# Patient Record
Sex: Female | Born: 1985 | Race: White | Hispanic: No | Marital: Married | Smoking: Current every day smoker
Health system: Southern US, Community
[De-identification: ages and names within clinical notes are randomized; demographics above are authoritative.]

## PROBLEM LIST (undated history)

## (undated) DIAGNOSIS — K8020 Calculus of gallbladder without cholecystitis without obstruction: Secondary | ICD-10-CM

## (undated) DIAGNOSIS — K759 Inflammatory liver disease, unspecified: Secondary | ICD-10-CM

---

## 2006-08-09 ENCOUNTER — Emergency Department (HOSPITAL_COMMUNITY): Admission: EM | Admit: 2006-08-09 | Discharge: 2006-08-09 | Payer: Self-pay | Admitting: Emergency Medicine

## 2006-09-06 ENCOUNTER — Emergency Department (HOSPITAL_COMMUNITY): Admission: EM | Admit: 2006-09-06 | Discharge: 2006-09-06 | Payer: Self-pay | Admitting: Emergency Medicine

## 2006-10-08 ENCOUNTER — Emergency Department (HOSPITAL_COMMUNITY): Admission: EM | Admit: 2006-10-08 | Discharge: 2006-10-08 | Payer: Self-pay | Admitting: Family Medicine

## 2007-01-15 ENCOUNTER — Emergency Department (HOSPITAL_COMMUNITY): Admission: EM | Admit: 2007-01-15 | Discharge: 2007-01-15 | Payer: Self-pay | Admitting: Emergency Medicine

## 2007-02-04 ENCOUNTER — Emergency Department (HOSPITAL_COMMUNITY): Admission: EM | Admit: 2007-02-04 | Discharge: 2007-02-04 | Payer: Self-pay | Admitting: Emergency Medicine

## 2007-02-15 ENCOUNTER — Emergency Department (HOSPITAL_COMMUNITY): Admission: EM | Admit: 2007-02-15 | Discharge: 2007-02-15 | Payer: Self-pay | Admitting: Emergency Medicine

## 2007-02-19 ENCOUNTER — Emergency Department (HOSPITAL_COMMUNITY): Admission: EM | Admit: 2007-02-19 | Discharge: 2007-02-19 | Payer: Self-pay | Admitting: Emergency Medicine

## 2007-03-02 ENCOUNTER — Emergency Department (HOSPITAL_COMMUNITY): Admission: EM | Admit: 2007-03-02 | Discharge: 2007-03-02 | Payer: Self-pay | Admitting: Emergency Medicine

## 2007-03-08 ENCOUNTER — Emergency Department (HOSPITAL_COMMUNITY): Admission: EM | Admit: 2007-03-08 | Discharge: 2007-03-08 | Payer: Self-pay | Admitting: Emergency Medicine

## 2007-03-26 ENCOUNTER — Emergency Department (HOSPITAL_COMMUNITY): Admission: EM | Admit: 2007-03-26 | Discharge: 2007-03-26 | Payer: Self-pay | Admitting: Emergency Medicine

## 2008-09-19 DIAGNOSIS — J45909 Unspecified asthma, uncomplicated: Secondary | ICD-10-CM

## 2008-09-19 DIAGNOSIS — F1120 Opioid dependence, uncomplicated: Secondary | ICD-10-CM

## 2008-10-11 DIAGNOSIS — R300 Dysuria: Secondary | ICD-10-CM

## 2008-11-01 ENCOUNTER — Ambulatory Visit (HOSPITAL_COMMUNITY): Admission: RE | Admit: 2008-11-01 | Discharge: 2008-11-01 | Payer: Self-pay | Admitting: Urology

## 2009-01-02 ENCOUNTER — Emergency Department (HOSPITAL_COMMUNITY): Admission: EM | Admit: 2009-01-02 | Discharge: 2009-01-02 | Payer: Self-pay | Admitting: Emergency Medicine

## 2009-01-24 DIAGNOSIS — N3020 Other chronic cystitis without hematuria: Secondary | ICD-10-CM

## 2009-10-04 ENCOUNTER — Emergency Department: Payer: Self-pay | Admitting: Emergency Medicine

## 2010-05-26 ENCOUNTER — Emergency Department: Payer: Self-pay | Admitting: Emergency Medicine

## 2010-11-17 LAB — URINALYSIS, ROUTINE W REFLEX MICROSCOPIC
Glucose, UA: NEGATIVE mg/dL
Hgb urine dipstick: NEGATIVE
Specific Gravity, Urine: 1.014 (ref 1.005–1.030)
Urobilinogen, UA: 2 mg/dL — ABNORMAL HIGH (ref 0.0–1.0)

## 2010-11-17 LAB — URINE MICROSCOPIC-ADD ON

## 2011-05-24 LAB — DIFFERENTIAL
Eosinophils Absolute: 0.1
Eosinophils Relative: 1
Lymphs Abs: 4.2 — ABNORMAL HIGH
Monocytes Relative: 4

## 2011-05-24 LAB — POCT URINALYSIS DIP (DEVICE)
Bilirubin Urine: NEGATIVE
Ketones, ur: NEGATIVE
Specific Gravity, Urine: 1.02

## 2011-05-24 LAB — CBC
HCT: 44.4
MCV: 87.9
RBC: 5.05
WBC: 11.9 — ABNORMAL HIGH

## 2011-05-24 LAB — POCT PREGNANCY, URINE
Operator id: 239701
Preg Test, Ur: NEGATIVE

## 2011-05-24 LAB — RAPID URINE DRUG SCREEN, HOSP PERFORMED
Amphetamines: NOT DETECTED
Barbiturates: NOT DETECTED
Benzodiazepines: NOT DETECTED

## 2011-07-14 ENCOUNTER — Emergency Department: Payer: Self-pay | Admitting: Emergency Medicine

## 2012-05-27 ENCOUNTER — Emergency Department: Payer: Self-pay | Admitting: Emergency Medicine

## 2012-06-04 ENCOUNTER — Emergency Department: Payer: Self-pay | Admitting: Neurosurgery

## 2013-05-01 ENCOUNTER — Emergency Department: Payer: Self-pay | Admitting: Internal Medicine

## 2015-04-11 ENCOUNTER — Emergency Department: Payer: Medicaid Other

## 2015-04-11 ENCOUNTER — Other Ambulatory Visit: Payer: Self-pay

## 2015-04-11 ENCOUNTER — Emergency Department
Admission: EM | Admit: 2015-04-11 | Discharge: 2015-04-11 | Disposition: A | Discharge status: Home / Self Care | Payer: Medicaid Other | Attending: Emergency Medicine | Admitting: Emergency Medicine

## 2015-04-11 ENCOUNTER — Encounter: Payer: Self-pay | Admitting: *Deleted

## 2015-04-11 DIAGNOSIS — R1011 Right upper quadrant pain: Secondary | ICD-10-CM

## 2015-04-11 DIAGNOSIS — Z720 Tobacco use: Secondary | ICD-10-CM | POA: Diagnosis not present

## 2015-04-11 DIAGNOSIS — R1013 Epigastric pain: Secondary | ICD-10-CM | POA: Diagnosis present

## 2015-04-11 DIAGNOSIS — K8020 Calculus of gallbladder without cholecystitis without obstruction: Secondary | ICD-10-CM | POA: Diagnosis not present

## 2015-04-11 HISTORY — DX: Calculus of gallbladder without cholecystitis without obstruction: K80.20

## 2015-04-11 HISTORY — DX: Inflammatory liver disease, unspecified: K75.9

## 2015-04-11 LAB — COMPREHENSIVE METABOLIC PANEL
ALT: 19 U/L (ref 14–54)
AST: 26 U/L (ref 15–41)
Albumin: 4.3 g/dL (ref 3.5–5.0)
Alkaline Phosphatase: 51 U/L (ref 38–126)
Anion gap: 10 (ref 5–15)
BUN: 7 mg/dL (ref 6–20)
CHLORIDE: 99 mmol/L — AB (ref 101–111)
CO2: 26 mmol/L (ref 22–32)
CREATININE: 0.77 mg/dL (ref 0.44–1.00)
Calcium: 9.4 mg/dL (ref 8.9–10.3)
GFR calc Af Amer: >60 mL/min (ref >60)
Glucose, Bld: 125 mg/dL — ABNORMAL HIGH (ref 65–99)
Potassium: 3.4 mmol/L — ABNORMAL LOW (ref 3.5–5.1)
Sodium: 135 mmol/L (ref 135–145)
Total Bilirubin: 0.7 mg/dL (ref 0.3–1.2)
Total Protein: 8.1 g/dL (ref 6.5–8.1)

## 2015-04-11 LAB — LIPASE, BLOOD: LIPASE: 19 U/L — AB (ref 22–51)

## 2015-04-11 LAB — CBC
HCT: 43.7 % (ref 35.0–47.0)
Hemoglobin: 14.2 g/dL (ref 12.0–16.0)
MCH: 29.1 pg (ref 26.0–34.0)
MCHC: 32.5 g/dL (ref 32.0–36.0)
MCV: 89.6 fL (ref 80.0–100.0)
PLATELETS: 203 10*3/uL (ref 150–440)
RBC: 4.88 MIL/uL (ref 3.80–5.20)
RDW: 14.4 % (ref 11.5–14.5)
WBC: 13.1 10*3/uL — AB (ref 3.6–11.0)

## 2015-04-11 LAB — TROPONIN I: Troponin I: <0.03 ng/mL (ref <0.031)

## 2015-04-11 MED ORDER — ONDANSETRON 4 MG PO TBDP: 4.0000 mg | ORAL_TABLET | Freq: Three times a day (TID) | ORAL | Status: DC | PRN

## 2015-04-11 MED ORDER — KETOROLAC TROMETHAMINE 10 MG PO TABS
10.0000 mg | ORAL_TABLET | Freq: Once | ORAL | Status: AC
  Administered 2015-04-11: 10 mg via ORAL
  Filled 2015-04-11: qty 1

## 2015-04-11 MED ORDER — KETOROLAC TROMETHAMINE 10 MG PO TABS: 10.0000 mg | ORAL_TABLET | Freq: Three times a day (TID) | ORAL | Status: DC | PRN

## 2015-04-11 MED ORDER — ONDANSETRON 4 MG PO TBDP
4.0000 mg | ORAL_TABLET | Freq: Once | ORAL | Status: AC
  Administered 2015-04-11: 4 mg via ORAL
  Filled 2015-04-11: qty 1

## 2015-04-11 NOTE — Discharge Instructions (Signed)
Cholelithiasis °Cholelithiasis (also called gallstones) is a form of gallbladder disease in which gallstones form in your gallbladder. The gallbladder is an organ that stores bile made in the liver, which helps digest fats. Gallstones begin as small crystals and slowly grow into stones. Gallstone pain occurs when the gallbladder spasms and a gallstone is blocking the duct. Pain can also occur when a stone passes out of the duct.  °RISK FACTORS °· Being female.   °· Having multiple pregnancies. Health care providers sometimes advise removing diseased gallbladders before future pregnancies.   °· Being obese. °· Eating a diet heavy in fried foods and fat.   °· Being older than 60 years and increasing age.   °· Prolonged use of medicines containing female hormones.   °· Having diabetes mellitus.   °· Rapidly losing weight.   °· Having a family history of gallstones (heredity).   °SYMPTOMS °· Nausea.   °· Vomiting. °· Abdominal pain.   °· Yellowing of the skin (jaundice).   °· Sudden pain. It may persist from several minutes to several hours. °· Fever.   °· Tenderness to the touch.  °In some cases, when gallstones do not move into the bile duct, people have no pain or symptoms. These are called "silent" gallstones.  °TREATMENT °Silent gallstones do not need treatment. In severe cases, emergency surgery may be required. Options for treatment include: °· Surgery to remove the gallbladder. This is the most common treatment. °· Medicines. These do not always work and may take 6-12 months or more to work. °· Shock wave treatment (extracorporeal biliary lithotripsy). In this treatment an ultrasound machine sends shock waves to the gallbladder to break gallstones into smaller pieces that can pass into the intestines or be dissolved by medicine. °HOME CARE INSTRUCTIONS  °· Only take over-the-counter or prescription medicines for pain, discomfort, or fever as directed by your health care provider.   °· Follow a low-fat diet until  seen again by your health care provider. Fat causes the gallbladder to contract, which can result in pain.   °· Follow up with your health care provider as directed. Attacks are almost always recurrent and surgery is usually required for permanent treatment.   °SEEK IMMEDIATE MEDICAL CARE IF:  °· Your pain increases and is not controlled by medicines.   °· You have a fever or persistent symptoms for more than 2-3 days.   °· You have a fever and your symptoms suddenly get worse.   °· You have persistent nausea and vomiting.   °MAKE SURE YOU:  °· Understand these instructions. °· Will watch your condition. °· Will get help right away if you are not doing well or get worse. °Document Released: 07/22/2005 Document Revised: 03/28/2013 Document Reviewed: 01/17/2013 °[employer]® Patient Information ©2015 [employer]. This information is not intended to replace advice given to you by your health care provider. Make sure you discuss any questions you have with your health care provider. ° °

## 2015-04-11 NOTE — ED Notes (Signed)
Pt reports upper abdominal and back pain for about 1 month. States she was dx with gallstones at [facility] practice about 1.5 months ago, pain feels similar.

## 2015-04-11 NOTE — ED Provider Notes (Signed)
[facility] Emergency Department Provider Note  ____________________________________________  Time seen: 3:30 AM  I have reviewed the triage vital signs and the nursing notes.   HISTORY  Chief Complaint Abdominal Pain     HPI Claudia Duke is a 29 y.o. female presents with right upper quadrant/epigastric pain times one month with acute worsening tonight. Patient states that she was diagnosed with gallstones at [facility] family practice approximately 1-1/2 months ago and that this pain is consistent with that. She denies any vomiting no nausea time. In addition patient admits to crack cocaine use last night with resultant chest pain. Patient denies any dyspnea    Past Medical History  Diagnosis Date  . Gallstone   . Hepatitis     There are no active problems to display for this patient.   Past surgical history None No current outpatient prescriptions on file.  Allergies Septra  No family history on file.  Social History Social History  Substance Use Topics  . Smoking status: Current Every Day Smoker  . Smokeless tobacco: None  . Alcohol Use: Yes    Review of Systems  Constitutional: Negative for fever. Eyes: Negative for visual changes. ENT: Negative for sore throat. Cardiovascular: Negative for chest pain. Respiratory: Negative for shortness of breath. Gastrointestinal: Positive for abdominal pain, negative for vomiting and diarrhea. Genitourinary: Negative for dysuria. Musculoskeletal: Negative for back pain. Skin: Negative for rash. Neurological: Negative for headaches, focal weakness or numbness.   10-point ROS otherwise negative.  ____________________________________________   PHYSICAL EXAM:  VITAL SIGNS: ED Triage Vitals  Enc Vitals Group     BP 04/11/15 0237 118/81 mmHg     Pulse Rate 04/11/15 0237 102     Resp 04/11/15 0237 16     Temp 04/11/15 0237 98.3 F (36.8 C)     Temp Source 04/11/15 0237 Oral     SpO2  04/11/15 0237 99 %     Weight 04/11/15 0237 120 lb (54.432 kg)     Height 04/11/15 0237  (1.6 m)     Head Cir --      Peak Flow --      Pain Score 04/11/15 0237 8     Pain Loc --      Pain Edu? --      Excl. in GC? --      Constitutional: Alert and oriented. Well appearing and in no distress. Eyes: Conjunctivae are normal. PERRL. Normal extraocular movements. ENT   Head: Normocephalic and atraumatic.   Nose: No congestion/rhinnorhea.   Mouth/Throat: Mucous membranes are moist.   Neck: No stridor. Hematological/Lymphatic/Immunilogical: No cervical lymphadenopathy. Cardiovascular: Normal rate, regular rhythm. Normal and symmetric distal pulses are present in all extremities. No murmurs, rubs, or gallops. Respiratory: Normal respiratory effort without tachypnea nor retractions. Breath sounds are clear and equal bilaterally. No wheezes/rales/rhonchi. Gastrointestinal: Right upper quadrant pain with palpation. No distention. There is no CVA tenderness. Genitourinary: deferred Musculoskeletal: Nontender with normal range of motion in all extremities. No joint effusions.  No lower extremity tenderness nor edema. Neurologic:  Normal speech and language. No gross focal neurologic deficits are appreciated. Speech is normal.  Skin:  Skin is warm, dry and intact. No rash noted. Psychiatric: Mood and affect are normal. Speech and behavior are normal. Patient exhibits appropriate insight and judgment.  ____________________________________________    LABS (pertinent positives/negatives)  Labs Reviewed  LIPASE, BLOOD - Abnormal; Notable for the following:    Lipase 19 (*)    All other components  within normal limits  COMPREHENSIVE METABOLIC PANEL - Abnormal; Notable for the following:    Potassium 3.4 (*)    Chloride 99 (*)    Glucose, Bld 125 (*)    All other components within normal limits  CBC - Abnormal; Notable for the following:    WBC 13.1 (*)    All other  components within normal limits  TROPONIN I  URINALYSIS COMPLETEWITH MICROSCOPIC ([facility] ONLY)  URINE DRUG SCREEN, QUALITATIVE ([facility] ONLY)  POC URINE PREG, ED     ____________________________________________   EKG  ED ECG REPORT I, BROWN, [place] N, the attending physician, personally viewed and interpreted this ECG.   Date: 04/11/2015  EKG Time: 3:57 AM  Rate: 87  Rhythm: Normal sinus rhythm  Axis: None  Intervals: Normal  ST&T Change: None   ____________________________________________    RADIOLOGY  US Abdomen Limited RUQ (Final result) Result time: 04/11/15 05:00:11   Procedure changed from US Abdomen Limited      Final result by Rad Results In Interface (04/11/15 05:00:11)   Narrative:   CLINICAL DATA: Right upper quadrant pain for 1-2 days  EXAM: US ABDOMEN LIMITED - RIGHT UPPER QUADRANT  COMPARISON: None.  FINDINGS: Gallbladder:  Multiple calculi are visible in the gallbladder. The largest is 1.2 cm. There is no gallbladder wall thickening or pericholecystic fluid. The patient was not tender over the gallbladder.  Common bile duct:  Diameter: 2.8 mm  Liver:  No focal lesion identified. Within normal limits in parenchymal echogenicity.  IMPRESSION: Cholelithiasis without evidence of cholecystitis.   Electronically Signed By: Ellery Plunk M.D.      INITIAL IMPRESSION / ASSESSMENT AND PLAN / ED COURSE  Pertinent labs & imaging results that were available during my care of the patient were reviewed by me and considered in my medical decision making (see chart for details).  History of physical exam consistent with cholelithiasis which was confirmed with ultrasound findings. No evidence of cholecystitis. We'll refer the patient to Dr. Lovett Sox for outpatient management of onset, getting cholelithiasis.  ____________________________________________   FINAL CLINICAL IMPRESSION(S) / ED DIAGNOSES  Final diagnoses:  Gallstones       Darci Current, MD 04/11/15 [phone]

## 2015-04-23 ENCOUNTER — Other Ambulatory Visit: Payer: Self-pay | Admitting: *Deleted

## 2015-04-23 ENCOUNTER — Encounter: Payer: Self-pay | Admitting: *Deleted

## 2015-04-28 ENCOUNTER — Ambulatory Visit: Payer: Self-pay | Admitting: Surgery

## 2015-04-30 ENCOUNTER — Ambulatory Visit: Payer: Self-pay | Admitting: Surgery

## 2017-08-05 IMAGING — US US ABDOMEN LIMITED
1 series · 14 of 25 positions shown · non-contrast
Comparison: None.

CLINICAL DATA: Right upper quadrant pain for 1-2 days

EXAM:
US ABDOMEN LIMITED - RIGHT UPPER QUADRANT

[Series 1: us abdomen limited · 0.18mm/px · 14 of 36 slices shown]
[im 1/36]
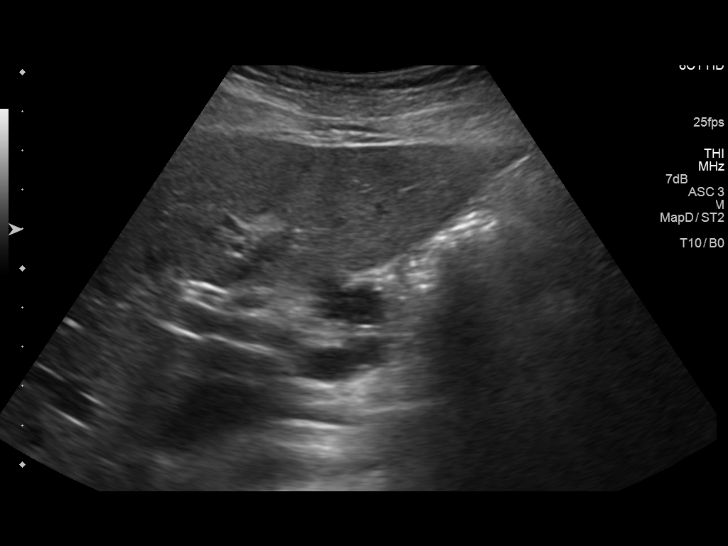
[im 3/36]
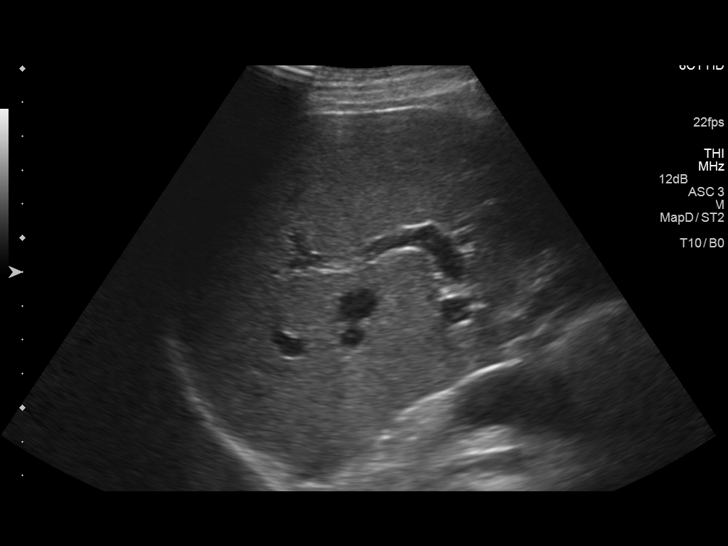
[im 6/36]
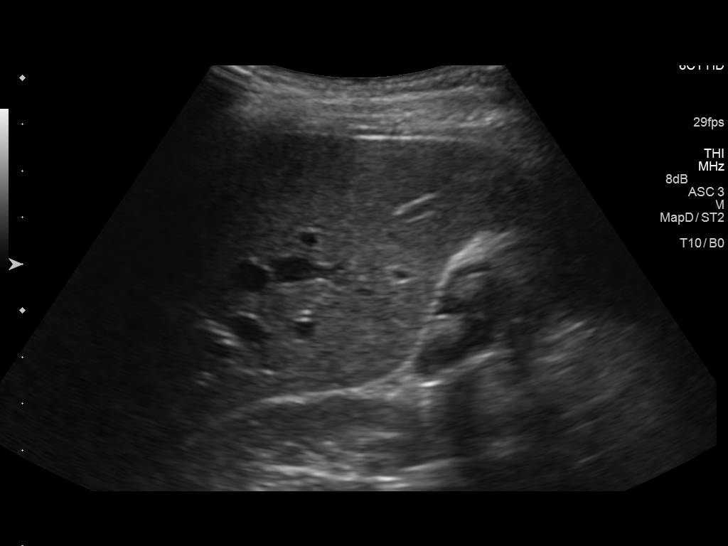
[im 9/36]
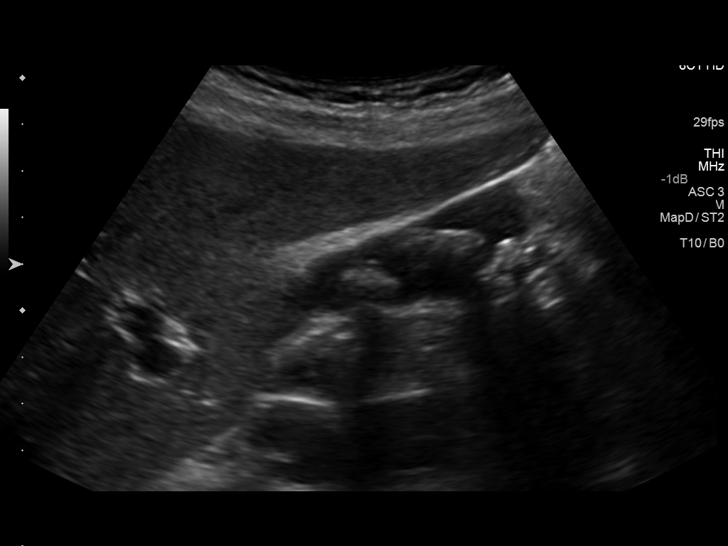
[im 12/36]
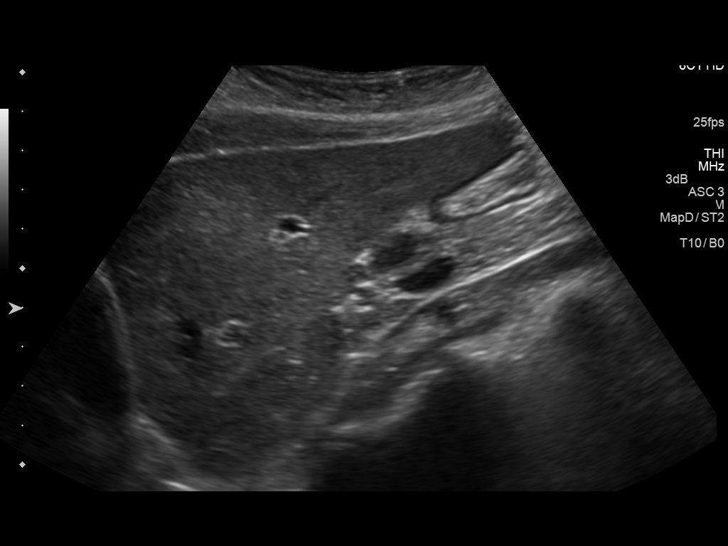
[im 14/36]
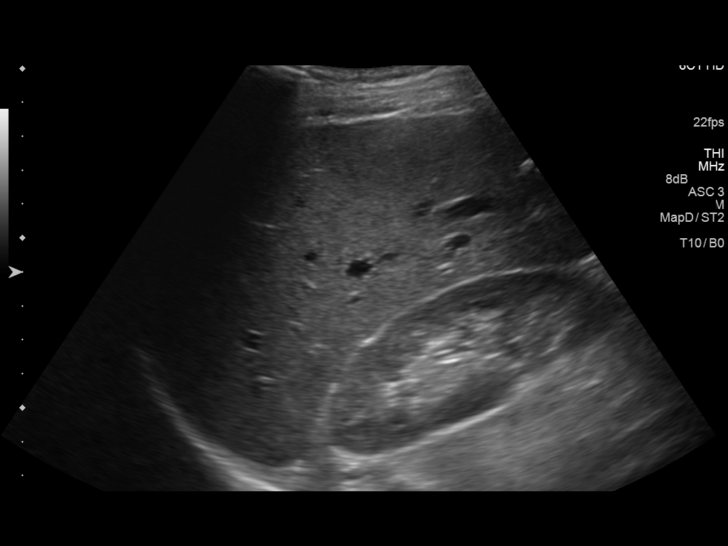
[im 17/36]
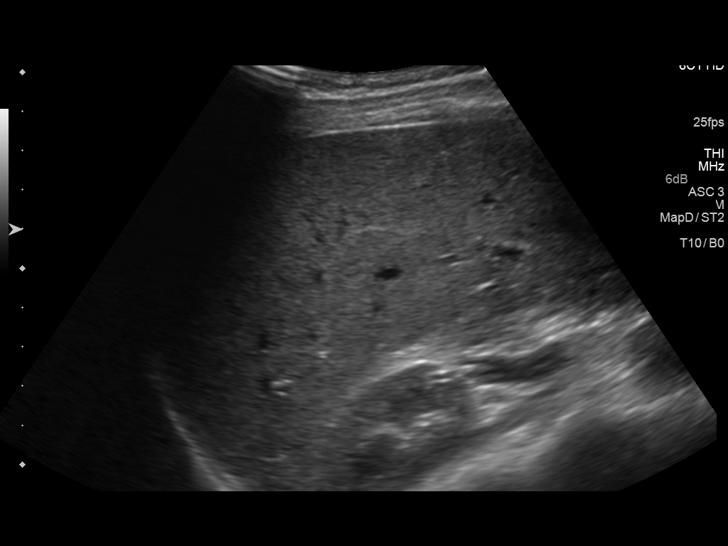
[im 19/36]
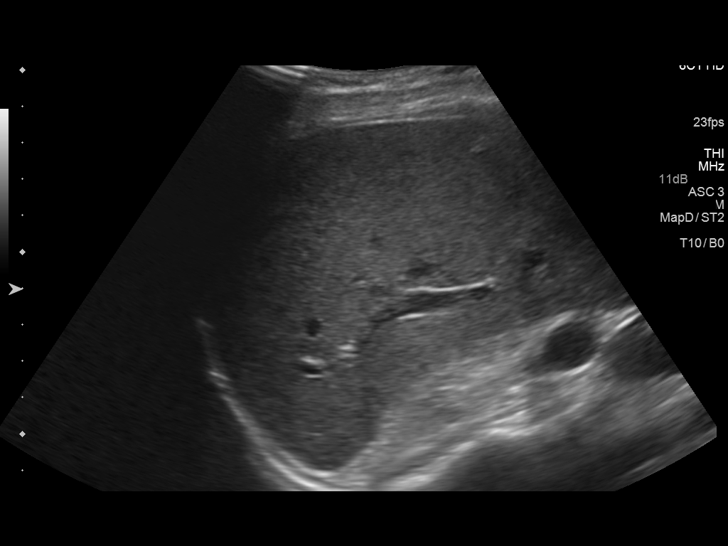
[im 22/36]
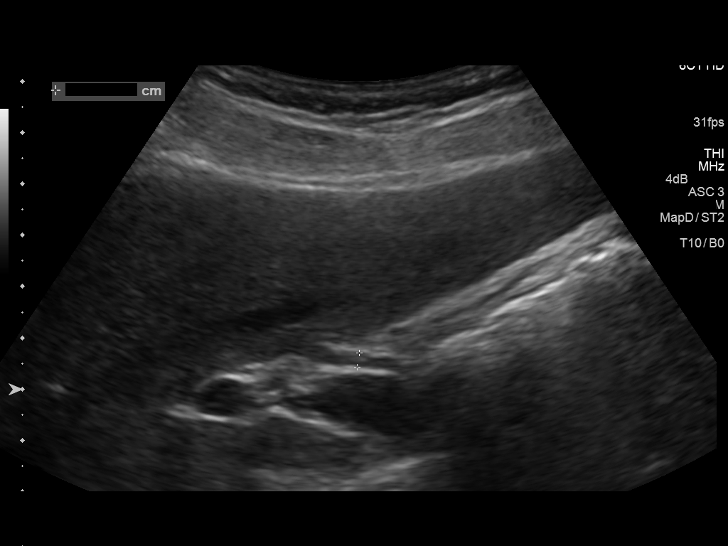
[im 24/36]
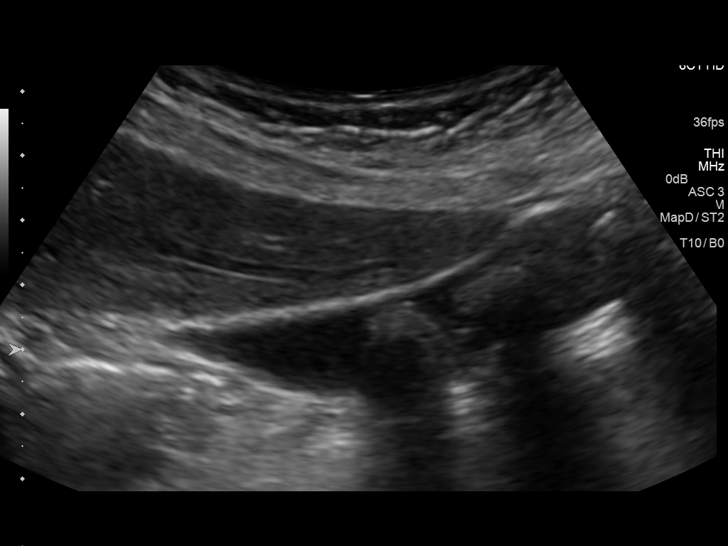
[im 27/36]
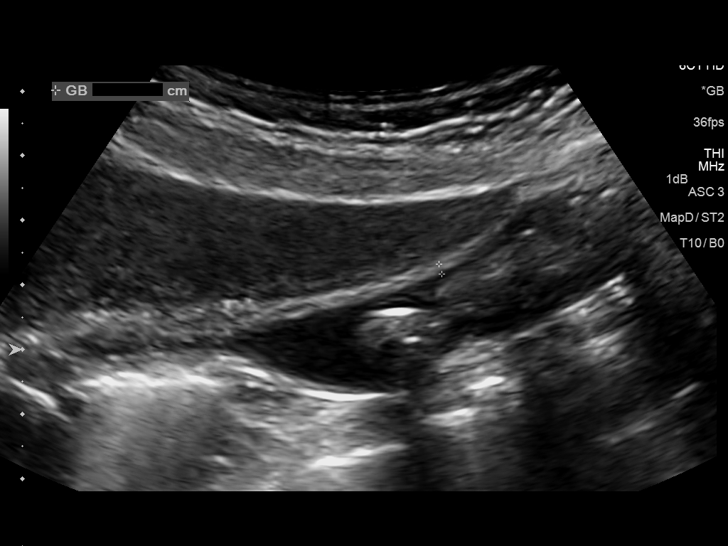
[im 30/36]
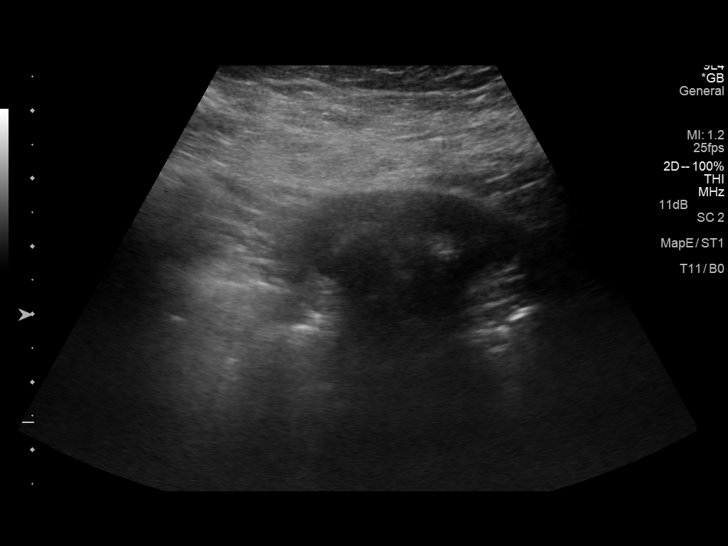
[im 33/36]
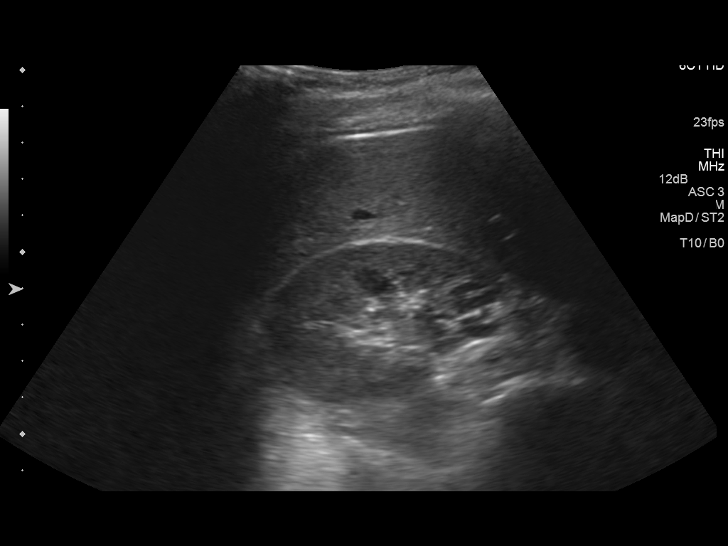
[im 36/36]
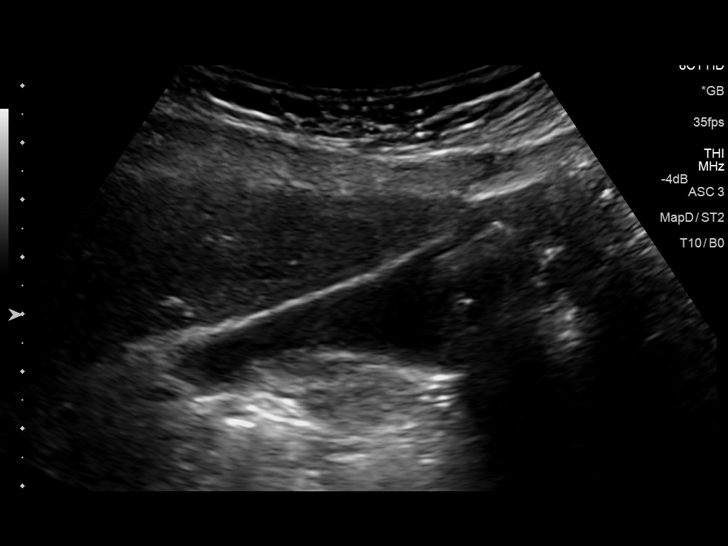

[14 of 25 positions shown; findings below may reference images not displayed]

FINDINGS: Gallbladder:

Multiple calculi are visible in the gallbladder. The largest is
cm. There is no gallbladder wall thickening or pericholecystic
fluid. The patient was not tender over the gallbladder.

Common bile duct:

Diameter: 2.8 mm

Liver:

No focal lesion identified. Within normal limits in parenchymal
echogenicity.
IMPRESSION: Cholelithiasis without evidence of cholecystitis.

## 2018-01-01 ENCOUNTER — Encounter: Payer: Self-pay | Admitting: Emergency Medicine

## 2018-01-01 ENCOUNTER — Other Ambulatory Visit: Payer: Self-pay

## 2018-01-01 ENCOUNTER — Emergency Department
Admission: EM | Admit: 2018-01-01 | Discharge: 2018-01-02 | Disposition: A | Discharge status: Other Acute Inpatient Hospital | Payer: Self-pay | Attending: Emergency Medicine | Admitting: Emergency Medicine

## 2018-01-01 DIAGNOSIS — R462 Strange and inexplicable behavior: Secondary | ICD-10-CM

## 2018-01-01 DIAGNOSIS — F329 Major depressive disorder, single episode, unspecified: Secondary | ICD-10-CM | POA: Insufficient documentation

## 2018-01-01 DIAGNOSIS — Z79899 Other long term (current) drug therapy: Secondary | ICD-10-CM | POA: Insufficient documentation

## 2018-01-01 DIAGNOSIS — F29 Unspecified psychosis not due to a substance or known physiological condition: Secondary | ICD-10-CM

## 2018-01-01 DIAGNOSIS — F1120 Opioid dependence, uncomplicated: Secondary | ICD-10-CM | POA: Diagnosis present

## 2018-01-01 DIAGNOSIS — F17210 Nicotine dependence, cigarettes, uncomplicated: Secondary | ICD-10-CM | POA: Insufficient documentation

## 2018-01-01 LAB — CBC WITH DIFFERENTIAL/PLATELET
BASOS ABS: 0.1 10*3/uL (ref 0–0.1)
BASOS PCT: 1 %
EOS ABS: 0 10*3/uL (ref 0–0.7)
Eosinophils Relative: 0 %
HCT: 44.4 % (ref 35.0–47.0)
HEMOGLOBIN: 15.3 g/dL (ref 12.0–16.0)
Lymphocytes Relative: 21 %
Lymphs Abs: 2 10*3/uL (ref 1.0–3.6)
MCH: 32.8 pg (ref 26.0–34.0)
MCHC: 34.5 g/dL (ref 32.0–36.0)
MCV: 95.1 fL (ref 80.0–100.0)
Monocytes Absolute: 0.8 10*3/uL (ref 0.2–0.9)
Monocytes Relative: 8 %
NEUTROS ABS: 6.8 10*3/uL — AB (ref 1.4–6.5)
NEUTROS PCT: 70 %
Platelets: 222 10*3/uL (ref 150–440)
RBC: 4.67 MIL/uL (ref 3.80–5.20)
RDW: 13.9 % (ref 11.5–14.5)
WBC: 9.7 10*3/uL (ref 3.6–11.0)

## 2018-01-01 LAB — COMPREHENSIVE METABOLIC PANEL
ALK PHOS: 51 U/L (ref 38–126)
ALT: 45 U/L (ref 14–54)
AST: 46 U/L — AB (ref 15–41)
Albumin: 4.9 g/dL (ref 3.5–5.0)
Anion gap: 13 (ref 5–15)
BUN: 16 mg/dL (ref 6–20)
CHLORIDE: 97 mmol/L — AB (ref 101–111)
CO2: 22 mmol/L (ref 22–32)
CREATININE: 0.84 mg/dL (ref 0.44–1.00)
Calcium: 9.7 mg/dL (ref 8.9–10.3)
GFR calc non Af Amer: >60 mL/min (ref >60)
GLUCOSE: 85 mg/dL (ref 65–99)
Potassium: 3.7 mmol/L (ref 3.5–5.1)
SODIUM: 132 mmol/L — AB (ref 135–145)
Total Bilirubin: 1.5 mg/dL — ABNORMAL HIGH (ref 0.3–1.2)
Total Protein: 9.4 g/dL — ABNORMAL HIGH (ref 6.5–8.1)

## 2018-01-01 LAB — ETHANOL: Alcohol, Ethyl (B): <10 mg/dL (ref <10)

## 2018-01-01 LAB — SALICYLATE LEVEL: Salicylate Lvl: <7 mg/dL (ref 2.8–30.0)

## 2018-01-01 LAB — ACETAMINOPHEN LEVEL

## 2018-01-01 MED ORDER — LORAZEPAM 2 MG/ML IJ SOLN
2.0000 mg | Freq: Once | INTRAMUSCULAR | Status: AC
  Administered 2018-01-01: 2 mg via INTRAMUSCULAR
  Filled 2018-01-01 (×2): qty 1

## 2018-01-01 MED ORDER — HALOPERIDOL LACTATE 5 MG/ML IJ SOLN
5.0000 mg | Freq: Once | INTRAMUSCULAR | Status: AC
  Administered 2018-01-01: 5 mg via INTRAMUSCULAR
  Filled 2018-01-01: qty 1

## 2018-01-01 MED ORDER — NICOTINE 21 MG/24HR TD PT24
21.0000 mg | MEDICATED_PATCH | Freq: Once | TRANSDERMAL | Status: AC
  Administered 2018-01-01: 21 mg via TRANSDERMAL
  Filled 2018-01-01: qty 1

## 2018-01-01 NOTE — ED Notes (Signed)
Hourly rounding reveals patient in sleeping room. No complaints, stable, in no acute distress. Q15 minute rounds and monitoring via Rover and Officer to continue.  

## 2018-01-01 NOTE — ED Notes (Signed)
Report from Cecile Sheerer RN. Patient sleeping, respirations regular and unlabored. Q15 minute rounds and observation by Psychologist, counselling to continue.

## 2018-01-01 NOTE — ED Notes (Signed)
No blood drawn in triage. Per [employer] pt with potential to become combative. Pt changed into burgundy scrubs voluntarily and calmly, back to room 21. Britta Mccreedy, RN made aware that patient had not had blood drawn.
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
## 2018-01-01 NOTE — ED Notes (Signed)
Hourly rounding reveals patient sleeping in room. No complaints, stable, in no acute distress. Q15 minute rounds and monitoring via Rover and Officer to continue.  
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
## 2018-01-01 NOTE — ED Notes (Signed)
Patient tearful and crying she wants to go home.

## 2018-01-01 NOTE — ED Notes (Signed)
ivc 
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
## 2018-01-01 NOTE — ED Notes (Signed)
Patient attempted to elope form unit by running. Stopped by security. Patient administer ativan 2 mg Im and haldol 5 mg IM. Patient is currently resting on bed with eyes opened.

## 2018-01-01 NOTE — BH Assessment (Signed)
Assessment Note  Claudia Duke is an 32 y.o. female. Patient presents to [facility] under IVC via [facility] due to erratic behaviors. Patient states she was walking in the middle of the street and the police picked her up and brought her to the hospital. Per IVC paperwork patient was acting very strange and running through the woods claiming gunshots were being fired, broke into someone house and stole a friends car. Patient states she is unsure why she has exhibited these behaviors. Patient denies SI, HI, and AVH. Patient denies previous inpatient psychiatric hospitalizations. Patient endorses depression.  Patient denies having current outpatient mental health providers.  Patient has a court date of 03/07/2018 for pending charges of larceny of a vehicles and breaking and entering.  Patient endorses THC and ETOH use.  Patient was presented guarded with a anxious, tearful affect.     Diagnosis: Depression  Past Medical History:  Past Medical History:  Diagnosis Date  . Gallstone   . Hepatitis     History reviewed. No pertinent surgical history.  Family History: History reviewed. No pertinent family history.  Social History:  reports that she has been smoking cigarettes.  She has never used smokeless tobacco. She reports that she drinks alcohol. She reports that she does not use drugs.  Additional Social History:  Alcohol / Drug Use Pain Medications: SEE PTA  Prescriptions: SEE PTA  Over the Counter: SEE PTA  History of alcohol / drug use?: Yes Longest period of sobriety (when/how long): Unknown Substance #1 Name of Substance 1: THC  1 - Age of First Use: Unknown 1 - Amount (size/oz): Unknown 1 - Frequency: Unknown 1 - Duration: Unknown 1 - Last Use / Amount: Unknown Substance #2 Name of Substance 2: Alcohol 2 - Age of First Use: Unknown 2 - Amount (size/oz): Unknown 2 - Frequency: Unknown 2 - Duration: Unknown 2 - Last Use / Amount: Unknown  CIWA: CIWA-Ar BP: (!) 155/100 Pulse  Rate: (!) 117 COWS:    Allergies:  Allergies  Allergen Reactions  . Sulfamethoxazole-Trimethoprim Hives    Home Medications:  (Not in a hospital admission)  OB/GYN Status:  No LMP recorded.  General Assessment Data Assessment unable to be completed: (Assessment completed ) Location of Assessment: [facility] ED TTS Assessment: In system Is this a Tele or Face-to-Face Assessment?: Face-to-Face Is this an Initial Assessment or a Re-assessment for this encounter?: Initial Assessment Marital status: Single Maiden name: N/A Is patient pregnant?: No Pregnancy Status: No Living Arrangements: Non-relatives/Friends Can pt return to current living arrangement?: Yes Admission Status: Involuntary Is patient capable of signing voluntary admission?: Yes Referral Source: Self/Family/Friend Insurance type: No insurance   Medical Screening Exam [facility] Walk-in ONLY) Medical Exam completed: Yes  Crisis Care Plan Living Arrangements: Non-relatives/Friends Legal Guardian: Other:(None reported ) Name of Psychiatrist: N/A Name of Therapist: N/A  Education Status Is patient currently in school?: No Is the patient employed, unemployed or receiving disability?: Unemployed  Risk to self with the past 6 months Suicidal Ideation: No Has patient been a risk to self within the past 6 months prior to admission? : No Suicidal Intent: No Has patient had any suicidal intent within the past 6 months prior to admission? : No Is patient at risk for suicide?: No Suicidal Plan?: No Has patient had any suicidal plan within the past 6 months prior to admission? : No Access to Means: No What has been your use of drugs/alcohol within the last 12 months?: THC, Alcohol Previous Attempts/Gestures: No How many  times?: 0 Other Self Harm Risks: None reported  Triggers for Past Attempts: Other (Comment)(None reported ) Intentional Self Injurious Behavior: None Family Suicide History: No Recent stressful life  event(s): Other (Comment)(None reported ) Persecutory voices/beliefs?: No Depression: Yes Depression Symptoms: Tearfulness Substance abuse history and/or treatment for substance abuse?: Yes Suicide prevention information given to non-admitted patients: Not applicable  Risk to Others within the past 6 months Homicidal Ideation: No Does patient have any lifetime risk of violence toward others beyond the six months prior to admission? : No Thoughts of Harm to Others: No Current Homicidal Intent: No Current Homicidal Plan: No Access to Homicidal Means: No Identified Victim: None reported  History of harm to others?: No Assessment of Violence: None Noted Violent Behavior Description: None reported  Does patient have access to weapons?: No Criminal Charges Pending?: Yes Describe Pending Criminal Charges: larceny of a motor vehicle, larceny after breaking and entering Does patient have a court date: Yes Court Date: 03/07/18 Is patient on probation?: No  Psychosis Hallucinations: None noted Delusions: None noted  Mental Status Report Appearance/Hygiene: Disheveled, In scrubs Eye Contact: Poor Motor Activity: Agitation, Restlessness Speech: Pressured Level of Consciousness: Alert Mood: Anxious, Irritable Affect: Anxious, Irritable Anxiety Level: Severe Thought Processes: Thought Blocking Judgement: Impaired Orientation: Person, Place, Time, Appropriate for developmental age, Situation Obsessive Compulsive Thoughts/Behaviors: Minimal  Cognitive Functioning Concentration: Poor Memory: Remote Impaired, Recent Impaired Is patient IDD: No Is patient DD?: No Insight: Poor Impulse Control: Poor Appetite: Poor Have you had any weight changes? : No Change Amount of the weight change? (lbs): 0 lbs Sleep: No Change Total Hours of Sleep: 8 Vegetative Symptoms: None  ADLScreening [facility]) Patient's cognitive ability adequate to safely complete daily activities?:  Yes Patient able to express need for assistance with ADLs?: Yes Independently performs ADLs?: Yes (appropriate for developmental age)  Prior Inpatient Therapy Prior Inpatient Therapy: No  Prior Outpatient Therapy Prior Outpatient Therapy: No Does patient have an ACCT team?: No Does patient have Intensive In-House Services?  : No Does patient have [facility] services? : No Does patient have P4CC services?: No  ADL Screening (condition at time of admission) Patient's cognitive ability adequate to safely complete daily activities?: Yes Is the patient deaf or have difficulty hearing?: No Does the patient have difficulty seeing, even when wearing glasses/contacts?: No Does the patient have difficulty concentrating, remembering, or making decisions?: Yes Patient able to express need for assistance with ADLs?: Yes Does the patient have difficulty dressing or bathing?: No Independently performs ADLs?: Yes (appropriate for developmental age) Does the patient have difficulty walking or climbing stairs?: No Weakness of Legs: None Weakness of Arms/Hands: None  Home Assistive Devices/Equipment Home Assistive Devices/Equipment: None  Therapy Consults (therapy consults require a physician order) PT Evaluation Needed: No OT Evalulation Needed: No SLP Evaluation Needed: No Abuse/Neglect Assessment (Assessment to be complete while patient is alone) Abuse/Neglect Assessment Can Be Completed: Yes Physical Abuse: Denies Verbal Abuse: Denies Sexual Abuse: Denies Exploitation of patient/patient's resources: Denies Self-Neglect: Denies Values / Beliefs Cultural Requests During Hospitalization: None Spiritual Requests During Hospitalization: None Consults Spiritual Care Consult Needed: No Social Work Consult Needed: No Merchant navy officer (For Healthcare) Does Patient Have a Medical Advance Directive?: No Would patient like information on creating a medical advance directive?: No - Patient  declined          Disposition:  Disposition Initial Assessment Completed for this Encounter: Yes Patient referred to: Other (Comment)(pending psych consult )  On Site Evaluation by:  Reviewed with Physician:    Galen Manila, LPC, LCAS-A 01/01/2018 6:08 PM

## 2018-01-02 ENCOUNTER — Inpatient Hospital Stay
Admission: AD | Admit: 2018-01-02 | Discharge: 2018-01-06 | DRG: 885 | Disposition: A | Discharge status: Home / Self Care | Payer: No Typology Code available for payment source | Attending: Psychiatry | Admitting: Psychiatry

## 2018-01-02 DIAGNOSIS — F22 Delusional disorders: Principal | ICD-10-CM | POA: Diagnosis present

## 2018-01-02 DIAGNOSIS — F1120 Opioid dependence, uncomplicated: Secondary | ICD-10-CM | POA: Diagnosis present

## 2018-01-02 DIAGNOSIS — N390 Urinary tract infection, site not specified: Secondary | ICD-10-CM | POA: Diagnosis present

## 2018-01-02 DIAGNOSIS — Z882 Allergy status to sulfonamides status: Secondary | ICD-10-CM

## 2018-01-02 DIAGNOSIS — F29 Unspecified psychosis not due to a substance or known physiological condition: Secondary | ICD-10-CM | POA: Diagnosis not present

## 2018-01-02 DIAGNOSIS — Z79899 Other long term (current) drug therapy: Secondary | ICD-10-CM | POA: Diagnosis not present

## 2018-01-02 DIAGNOSIS — F15950 Other stimulant use, unspecified with stimulant-induced psychotic disorder with delusions: Secondary | ICD-10-CM | POA: Diagnosis present

## 2018-01-02 DIAGNOSIS — B850 Pediculosis due to Pediculus humanus capitis: Secondary | ICD-10-CM | POA: Diagnosis present

## 2018-01-02 DIAGNOSIS — F17200 Nicotine dependence, unspecified, uncomplicated: Secondary | ICD-10-CM | POA: Diagnosis present

## 2018-01-02 LAB — URINALYSIS, COMPLETE (UACMP) WITH MICROSCOPIC
Bilirubin Urine: NEGATIVE
GLUCOSE, UA: NEGATIVE mg/dL
Ketones, ur: 20 mg/dL — AB
NITRITE: POSITIVE — AB
PROTEIN: 100 mg/dL — AB
SPECIFIC GRAVITY, URINE: 1.028 (ref 1.005–1.030)
WBC, UA: >50 WBC/hpf — ABNORMAL HIGH (ref 0–5)
pH: 5 (ref 5.0–8.0)

## 2018-01-02 LAB — URINE DRUG SCREEN, QUALITATIVE (ARMC ONLY)
Amphetamines, Ur Screen: POSITIVE — AB
Barbiturates, Ur Screen: NOT DETECTED
Benzodiazepine, Ur Scrn: POSITIVE — AB
CANNABINOID 50 NG, UR ~~LOC~~: NOT DETECTED
COCAINE METABOLITE, UR ~~LOC~~: NOT DETECTED
MDMA (ECSTASY) UR SCREEN: NOT DETECTED
Methadone Scn, Ur: NOT DETECTED
Opiate, Ur Screen: NOT DETECTED
PHENCYCLIDINE (PCP) UR S: NOT DETECTED
Tricyclic, Ur Screen: NOT DETECTED

## 2018-01-02 LAB — PREGNANCY, URINE: PREG TEST UR: NEGATIVE

## 2018-01-02 MED ORDER — PERMETHRIN 1 % EX LOTN: TOPICAL_LOTION | Freq: Once | CUTANEOUS | Status: DC

## 2018-01-02 MED ORDER — HYDROXYZINE HCL 50 MG PO TABS
50.0000 mg | ORAL_TABLET | Freq: Three times a day (TID) | ORAL | Status: DC | PRN
  Administered 2018-01-02: 50 mg via ORAL
  Filled 2018-01-02: qty 1

## 2018-01-02 MED ORDER — LINDANE 1 % EX SHAM
MEDICATED_SHAMPOO | Freq: Once | CUTANEOUS | Status: AC
  Administered 2018-01-03: 08:00:00 via TOPICAL
  Filled 2018-01-02: qty 60

## 2018-01-02 MED ORDER — BUPRENORPHINE HCL-NALOXONE HCL 8-2 MG SL SUBL
1.0000 | SUBLINGUAL_TABLET | Freq: Two times a day (BID) | SUBLINGUAL | Status: DC
  Administered 2018-01-02: 1 via SUBLINGUAL
  Filled 2018-01-02: qty 1

## 2018-01-02 MED ORDER — MAGNESIUM HYDROXIDE 400 MG/5ML PO SUSP: 30.0000 mL | Freq: Every day | ORAL | Status: DC | PRN

## 2018-01-02 MED ORDER — ACETAMINOPHEN 325 MG PO TABS: 650.0000 mg | ORAL_TABLET | Freq: Four times a day (QID) | ORAL | Status: DC | PRN

## 2018-01-02 MED ORDER — RISPERIDONE 0.5 MG PO TBDP: 1.0000 mg | ORAL_TABLET | Freq: Two times a day (BID) | ORAL | Status: DC

## 2018-01-02 MED ORDER — ALUM & MAG HYDROXIDE-SIMETH 200-200-20 MG/5ML PO SUSP: 30.0000 mL | ORAL | Status: DC | PRN

## 2018-01-02 MED ORDER — NICOTINE 21 MG/24HR TD PT24
21.0000 mg | MEDICATED_PATCH | Freq: Once | TRANSDERMAL | Status: AC
  Administered 2018-01-03: 21 mg via TRANSDERMAL
  Filled 2018-01-02: qty 1

## 2018-01-02 NOTE — ED Notes (Signed)
Hourly rounding reveals patient sleeping in room. No complaints, stable, in no acute distress. Q15 minute rounds and monitoring via Rover and Officer to continue.  

## 2018-01-02 NOTE — Consult Note (Signed)
Psychiatry: Brief note, full note to follow.  32 year old woman with unclear past psychiatric history brought to the emergency room psychotic with bizarre behavior.  Still disorganized.  Admit to psychiatric ward.

## 2018-01-02 NOTE — ED Notes (Signed)
Dr. Toni Amend talking with Patient, Patient remained calm and cooperative, no signs of distress.

## 2018-01-02 NOTE — ED Notes (Signed)
Referral information for Psychiatric Hospitalization faxed to;   [facility] [phone]),   . [facility] [phone]),   . [place] [phone] or [phone])  . [facility] [phone]),   . [facility] [phone]),   . [facility] [phone])  . [place] [phone]),

## 2018-01-02 NOTE — Consult Note (Signed)
[place] Psychiatry Consult   Reason for Consult: Consult for 32 year old woman brought to the emergency room after being found acting psychotic and bizarre Referring Physician: Quale Patient Identification: Alaine Loughney MRN:  [id number] Principal Diagnosis: Psychosis [facility]) Diagnosis:   Patient Active Problem List   Diagnosis Date Noted  . Psychosis ([place]) [F29] 01/02/2018  . Bladder infection, chronic [N30.20] 01/24/2009  . Difficult or painful urination [R30.0] 10/11/2008  . Addiction, opium ([place]) [F11.20] 09/19/2008  . 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 female patient admitted with "I drink too much".  HPI: Patient interviewed chart reviewed.  32 year old woman brought in under petition which describes her as acting very bizarrely running through the woods talking nonsense.  Patient has been sleeping much of the morning.  She was somewhat difficult to arouse but when she did she told me she thought the problem is that she drank too much and then when woke up she did not know where she was so she called the police.  Patient cannot remember how much she was drinking but says that she drinks every few days and generally does not think it is a problem.  She admits she is also used marijuana recently.  Denies any other drug use.  Patient denies feeling depressed denies suicidal ideation denies knowledge of any hallucinations.  Has no memory apparently what was happening last night.  The drug screen did not show any alcohol which is a bit of a problem given her complaint that she thinks that she was drinking too much.  Patient is still very disorganized and unable to give much history or cooperate with much reasonable discussion.  Medical history: Past history of minor injuries but no known ongoing medical problems.  Substance abuse history: Past history of opiate abuse.  She says that she takes  Subutex regularly and I did confirm that through the controlled substance database.  Social history: She says she lives with a friend.  She is not currently working.  She has 1 son but the son does not live with her.  Past Psychiatric History: Patient denies any past psychiatric hospitalization denies any history of suicide attempts.  Does not know of any medicine that she is taking.  We have little or no past psychiatric history available in the old chart.  Risk to Self: Suicidal Ideation: No Suicidal Intent: No Is patient at risk for suicide?: No Suicidal Plan?: No Access to Means: No What has been your use of drugs/alcohol within the last 12 months?: THC, Alcohol How many times?: 0 Other Self Harm Risks: None reported  Triggers for Past Attempts: Other (Comment)(None reported ) Intentional Self Injurious Behavior: None Risk to Others: Homicidal Ideation: No Thoughts of Harm to Others: No Current Homicidal Intent: No Current Homicidal Plan: No Access to Homicidal Means: No Identified Victim: None reported  History of harm to others?: No Assessment of Violence: None Noted Violent Behavior Description: None reported  Does patient have access to weapons?: No Criminal Charges Pending?: Yes Describe Pending Criminal Charges: larceny of a motor vehicle, larceny after breaking and entering Does patient have a court date: Yes Court Date: 03/07/18 Prior Inpatient Therapy: Prior Inpatient Therapy: No Prior Outpatient Therapy: Prior Outpatient Therapy: No Does patient have an ACCT team?: No Does patient have Intensive In-House Services?  : No Does patient have [facility] services? : No Does patient have P4CC services?: No  Past  Medical History:  Past Medical History:  Diagnosis Date  . Gallstone   . Hepatitis    History reviewed. No pertinent surgical history. Family History: History reviewed. No pertinent family history. Family Psychiatric  History: Denies any Social History:   Social History   Substance and Sexual Activity  Alcohol Use Yes     Social History   Substance and Sexual Activity  Drug Use No    Social History   Socioeconomic History  . Marital status: Married    Spouse name: Not on file  . Number of children: Not on file  . Years of education: Not on file  . Highest education level: Not on file  Occupational History  . Not on file  Social Needs  . Financial resource strain: Not on file  . Food insecurity:    Worry: Not on file    Inability: Not on file  . Transportation needs:    Medical: Not on file    Non-medical: Not on file  Tobacco Use  . Smoking status: Current Every Day Smoker    Types: Cigarettes  . Smokeless tobacco: Never Used  Substance and Sexual Activity  . Alcohol use: Yes  . Drug use: No  . Sexual activity: Not on file  Lifestyle  . Physical activity:    Days per week: Not on file    Minutes per session: Not on file  . Stress: Not on file  Relationships  . Social connections:    Talks on phone: Not on file    Gets together: Not on file    Attends religious service: Not on file    Active member of club or organization: Not on file    Attends meetings of clubs or organizations: Not on file    Relationship status: Not on file  Other Topics Concern  . Not on file  Social History Narrative  . Not on file   Additional Social History:    Allergies:   Allergies  Allergen Reactions  . Sulfamethoxazole-Trimethoprim Hives    Labs:  Results for orders placed or performed during the hospital encounter of 01/01/18 (from the past 48 hour(s))  CBC with Differential     Status: Abnormal   Collection Time: 01/01/18  4:12 PM  Result Value Ref Range   WBC 9.7 3.6 - 11.0 K/uL   RBC 4.67 3.80 - 5.20 MIL/uL   Hemoglobin 15.3 12.0 - 16.0 g/dL   HCT 44.4 35.0 - 47.0 %   MCV 95.1 80.0 - 100.0 fL   MCH 32.8 26.0 - 34.0 pg   MCHC 34.5 32.0 - 36.0 g/dL   RDW 13.9 11.5 - 14.5 %   Platelets 222 150 - 440 K/uL    Neutrophils Relative % 70 %   Neutro Abs 6.8 (H) 1.4 - 6.5 K/uL   Lymphocytes Relative 21 %   Lymphs Abs 2.0 1.0 - 3.6 K/uL   Monocytes Relative 8 %   Monocytes Absolute 0.8 0.2 - 0.9 K/uL   Eosinophils Relative 0 %   Eosinophils Absolute 0.0 0 - 0.7 K/uL   Basophils Relative 1 %   Basophils Absolute 0.1 0 - 0.1 K/uL    Comment: Performed at [facility], [address] [place], [place] [address]  Comprehensive metabolic panel     Status: Abnormal   Collection Time: 01/01/18  4:12 PM  Result Value Ref Range   Sodium 132 (L) 135 - 145 mmol/L   Potassium 3.7 3.5 - 5.1 mmol/L  Chloride 97 (L) 101 - 111 mmol/L   CO2 22 22 - 32 mmol/L   Glucose, Bld 85 65 - 99 mg/dL   BUN 16 6 - 20 mg/dL   Creatinine, Ser 0.84 0.44 - 1.00 mg/dL   Calcium 9.7 8.9 - 10.3 mg/dL   Total Protein 9.4 (H) 6.5 - 8.1 g/dL   Albumin 4.9 3.5 - 5.0 g/dL   AST 46 (H) 15 - 41 U/L   ALT 45 14 - 54 U/L   Alkaline Phosphatase 51 38 - 126 U/L   Total Bilirubin 1.5 (H) 0.3 - 1.2 mg/dL   GFR calc non Af Amer >60 >60 mL/min   GFR calc Af Amer >60 >60 mL/min    Comment: (NOTE) The eGFR has been calculated using the CKD EPI equation. This calculation has not been validated in all clinical situations. eGFR's persistently <60 mL/min signify possible Chronic Kidney Disease.    Anion gap 13 5 - 15    Comment: Performed at [facility], [address] [place], [place] [address]  Ethanol     Status: None   Collection Time: 01/01/18  4:12 PM  Result Value Ref Range   Alcohol, Ethyl (B) <10 <10 mg/dL    Comment: (NOTE) Lowest detectable limit for serum alcohol is 10 mg/dL. For medical purposes only. Performed at [facility], [address] [place], [place] [address]   Acetaminophen level     Status: Abnormal   Collection Time: 01/01/18  4:12 PM  Result Value Ref Range   Acetaminophen (Tylenol), Serum <10 (L) 10 - 30 ug/mL    Comment: (NOTE) Therapeutic concentrations vary  significantly. A range of 10-30 ug/mL  may be an effective concentration for many patients. However, some  are best treated at concentrations outside of this range. Acetaminophen concentrations >150 ug/mL at 4 hours after ingestion  and >50 ug/mL at 12 hours after ingestion are often associated with  toxic reactions. Performed at [facility], [address] [place], [place] [address]   Salicylate level     Status: None   Collection Time: 01/01/18  4:12 PM  Result Value Ref Range   Salicylate Lvl <8.7 2.8 - 30.0 mg/dL    Comment: Performed at [facility], [address] [place], [place] [address]    Current Facility-Administered Medications  Medication Dose Route Frequency Provider Last Rate Last Dose  . buprenorphine-naloxone (SUBOXONE) 8-2 mg per SL tablet 1 tablet  1 tablet Sublingual BID Ellenore Roscoe T, MD      . nicotine (NICODERM CQ - dosed in mg/24 hours) patch 21 mg  21 mg Transdermal Once Delman Kitten, MD   21 mg at 01/01/18 1631   Current Outpatient Medications  Medication Sig Dispense Refill  . amphetamine-dextroamphetamine (ADDERALL) 20 MG tablet Take 20 mg by mouth 2 (two) times daily.  0  . buprenorphine (SUBUTEX) 8 MG SUBL SL tablet Place 8 mg under the tongue 3 (three) times daily.  0  . ketorolac (TORADOL) 10 MG tablet Take 1 tablet (10 mg total) by mouth every 8 (eight) hours as needed. (Patient not taking: Reported on 01/02/2018) 20 tablet 0  . ondansetron (ZOFRAN-ODT) 4 MG disintegrating tablet Take 1 tablet (4 mg total) by mouth every 8 (eight) hours as needed for nausea or vomiting. (Patient not taking: Reported on 01/02/2018) 20 tablet 0    Musculoskeletal: Strength & Muscle Tone: within normal limits Gait & Station: normal Patient leans: N/A  Psychiatric Specialty Exam: Physical  Exam  Nursing note and vitals reviewed. Constitutional: She appears well-developed and well-nourished.  HENT:  Head: Normocephalic and atraumatic.  Eyes:  Pupils are equal, round, and reactive to light. Conjunctivae are normal.  Neck: Normal range of motion.  Cardiovascular: Regular rhythm and normal heart sounds.  Respiratory: Effort normal. No respiratory distress.  GI: Soft.  Musculoskeletal: Normal range of motion.  Neurological: She is alert.  Skin: Skin is warm and dry.  Psychiatric: Her affect is blunt. Her speech is delayed. She is not agitated. Thought content is not paranoid. Cognition and memory are impaired. She expresses impulsivity. She is noncommunicative. She exhibits abnormal recent memory and abnormal remote memory.    Review of Systems  Constitutional: Negative.   HENT: Negative.   Eyes: Negative.   Respiratory: Negative.   Cardiovascular: Negative.   Gastrointestinal: Negative.   Musculoskeletal: Negative.   Skin: Negative.   Neurological: Negative.   Psychiatric/Behavioral: Positive for memory loss and substance abuse. Negative for depression, hallucinations and suicidal ideas. The patient is not nervous/anxious and does not have insomnia.     Blood pressure 95/68, pulse 60, temperature 98.4 F (36.9 C), temperature source Oral, resp. rate 16, height '5\' 4"'$  (1.626 m), weight 62.6 kg (138 lb), SpO2 100 %.Body mass index is 23.69 kg/m.  General Appearance: Disheveled  Eye Contact:  Minimal  Speech:  Slow  Volume:  Decreased  Mood:  Dysphoric  Affect:  Blunt  Thought Process:  Disorganized  Orientation:  Negative  Thought Content:  Illogical  Suicidal Thoughts:  No  Homicidal Thoughts:  No  Memory:  Immediate;   Fair Recent;   Poor Remote;   Poor  Judgement:  Impaired  Insight:  Lacking  Psychomotor Activity:  Decreased  Concentration:  Concentration: Poor  Recall:  Poor  Fund of Knowledge:  Poor  Language:  Poor  Akathisia:  No  Handed:  Right  AIMS (if indicated):     Assets:  Physical Health  ADL's:  Impaired  Cognition:  Impaired,  Moderate  Sleep:        Treatment Plan Summary: Daily  contact with patient to assess and evaluate symptoms and progress in treatment, Medication management and Plan This is a 32 year old woman who presented to the emergency room psychotic and agitated.  Now sedated and not able to give much useful history.  No urine drug screen done yet but the alcohol level was negative.  Patient does have a history of substance abuse confirmed as she is being prescribed Suboxone.  Also has had prescriptions for Adderall.  Not clear if she has been misusing it.  Plan is to admit patient to the psychiatric ward continue IV C.  PRN medication for agitation.  Restart her Suboxone as prescribed.  Follow-up evaluation as she becomes more lucid.  Disposition: Recommend psychiatric Inpatient admission when medically cleared.  Alethia Berthold, MD 01/02/2018 2:47 PM

## 2018-01-02 NOTE — ED Notes (Signed)
Pt. Alert and oriented, warm and dry, in no distress. Pt. Denies SI, HI, and AVH. Pt tearful with admission to BMU. Report given to [facility].

## 2018-01-02 NOTE — ED Notes (Signed)
Patient respirations even and unlabored, she is moving from side to side, but still sleeping, no signs of distress.

## 2018-01-02 NOTE — BH Assessment (Signed)
Patient is to be admitted to [facility] by Dr. Toni Amend.  Attending Physician will be Dr. Johnella Moloney.   Patient has been assigned to room 307, by [facility] Charge Nurse Gwen.   Intake Paper Work has been signed and placed on patient chart.  ER staff is aware of the admission:  [facility]   Dr. Fanny Bien, ER MD   Toniann Fail Patient's Nurse   Ethelene Browns Patient Access.

## 2018-01-02 NOTE — ED Notes (Signed)
Patient is awake, she is abrupt, agitated, but is redirectable, Patient wants to be able to leave and go home, nurse let her know that she would check on her status, Patient put blanket over head quickly, will continue to monitor.

## 2018-01-02 NOTE — ED Provider Notes (Signed)
-----------------------------------------   7:45 AM on 01/02/2018 -----------------------------------------   Blood pressure 95/68, pulse 60, temperature 98.4 F (36.9 C), temperature source Oral, resp. rate 16, height  (1.626 m), weight 62.6 kg (138 lb), SpO2 100 %.  The patient had no acute events since last update.  Calm and cooperative at this time.  Disposition is pending Psychiatry/Behavioral Medicine team recommendations.  The patient is to be admitted to inpatient psych.      Rebecka Apley, MD 01/02/18 [phone]

## 2018-01-03 ENCOUNTER — Other Ambulatory Visit: Payer: Self-pay

## 2018-01-03 DIAGNOSIS — F29 Unspecified psychosis not due to a substance or known physiological condition: Secondary | ICD-10-CM

## 2018-01-03 LAB — LIPID PANEL
CHOL/HDL RATIO: 1.9 ratio
CHOLESTEROL: 207 mg/dL — AB (ref 0–200)
HDL: 107 mg/dL (ref >40)
LDL Cholesterol: 91 mg/dL (ref 0–99)
Triglycerides: 45 mg/dL (ref <150)
VLDL: 9 mg/dL (ref 0–40)

## 2018-01-03 LAB — HEMOGLOBIN A1C
HEMOGLOBIN A1C: 4.7 % — AB (ref 4.8–5.6)
MEAN PLASMA GLUCOSE: 88.19 mg/dL

## 2018-01-03 LAB — TSH: TSH: 1.261 u[IU]/mL (ref 0.350–4.500)

## 2018-01-03 MED ORDER — LINDANE 1 % EX SHAM: MEDICATED_SHAMPOO | Freq: Once | CUTANEOUS | Status: DC

## 2018-01-03 MED ORDER — OLANZAPINE 10 MG IM SOLR: 5.0000 mg | Freq: Four times a day (QID) | INTRAMUSCULAR | Status: DC | PRN

## 2018-01-03 MED ORDER — BUPRENORPHINE HCL-NALOXONE HCL 8-2 MG SL SUBL
1.0000 | SUBLINGUAL_TABLET | Freq: Three times a day (TID) | SUBLINGUAL | Status: DC
Start: 2018-01-03 — End: 2018-01-06
  Administered 2018-01-03 – 2018-01-06 (×9): 1 via SUBLINGUAL
  Filled 2018-01-03 (×10): qty 1

## 2018-01-03 MED ORDER — CIPROFLOXACIN HCL 500 MG PO TABS
500.0000 mg | ORAL_TABLET | Freq: Two times a day (BID) | ORAL | Status: AC
  Administered 2018-01-03 – 2018-01-06 (×6): 500 mg via ORAL
  Filled 2018-01-03 (×6): qty 1

## 2018-01-03 MED ORDER — OLANZAPINE 10 MG PO TABS
10.0000 mg | ORAL_TABLET | Freq: Every day | ORAL | Status: DC
Start: 2018-01-03 — End: 2018-01-04
  Administered 2018-01-03: 10 mg via ORAL
  Filled 2018-01-03: qty 1

## 2018-01-03 MED ORDER — CIPROFLOXACIN HCL 500 MG PO TABS
500.0000 mg | ORAL_TABLET | Freq: Two times a day (BID) | ORAL | Status: DC
  Filled 2018-01-03 (×2): qty 1

## 2018-01-03 NOTE — Progress Notes (Signed)
Recreation Therapy Notes  Date: 01/03/2018  Time: 9:30 am   Location: Craft Room   Behavioral response: N/A   Intervention Topic:  Coping Skills  Discussion/Intervention: Patient did not attend group.   Clinical Observations/Feedback:  Patient did not attend group.   Valta Dillon LRT/CTRS        Claudia Duke 01/03/2018 10:30 AM 

## 2018-01-03 NOTE — Plan of Care (Addendum)
Patient found awake in bed upon my arrival. Patient is visible and social for a period of time this evening. Denies all complaints including SI/HI/AVH. Mood is irritable but cooperative. Initially refused Zyprexa but complied after prompting. Remains on Cipro for UTI, denies S/Sx. Denies pain. Reports eating and voiding adequately. Q 15 minute checks maintained. Will continue to monitor throughout the shift. Patient slept 7.75 hours. No apparent distress. Will endorse care to oncoming shift.  Problem: Coping: Goal: Ability to demonstrate self-control will improve Outcome: Progressing   Problem: Coping: Goal: Coping ability will improve Outcome: Progressing   Problem: Education: Goal: Knowledge of [place] [facility] Education information/materials will improve Outcome: Not Progressing

## 2018-01-03 NOTE — Tx Team (Signed)
Initial Treatment Plan 01/03/2018 6:23 AM [name] BJY:[id number]    PATIENT STRESSORS: Medication change or noncompliance Substance abuse   PATIENT STRENGTHS: Ability for insight Average or above average intelligence Capable of independent living Supportive family/friends   PATIENT IDENTIFIED PROBLEMS: Mood Instability  Substance Use d/o                   DISCHARGE CRITERIA:  Improved stabilization in mood, thinking, and/or behavior Motivation to continue treatment in a less acute level of care Verbal commitment to aftercare and medication compliance  PRELIMINARY DISCHARGE PLAN: Outpatient therapy Return to previous living arrangement  PATIENT/FAMILY INVOLVEMENT: This treatment plan has been presented to and reviewed with the patient, Claudia Duke.  The patient have been given the opportunity to ask questions and make suggestions.  Cleotis Nipper, RN 01/03/2018, 6:23 AM

## 2018-01-03 NOTE — [facility] Suicide Risk Assessment (Signed)
[facility] INPATIENT:  Family/Significant Other Suicide Prevention Education  Suicide Prevention Education:  Education Completed; Mirta Mally, [phone],  (name of family member/significant other) has been identified by the patient as the family member/significant other with whom the patient will be residing, and identified as the person(s) who will aid the patient in the event of a mental health crisis (suicidal ideations/suicide attempt).  With written consent from the patient, the family member/significant other has been provided the following suicide prevention education, prior to the and/or following the discharge of the patient.  The suicide prevention education provided includes the following:  Suicide risk factors  Suicide prevention and interventions  National Suicide Hotline telephone number  [facility] assessment telephone number  [facility] Emergency Assistance 911  [facility] and/or Residential Mobile Crisis Unit telephone number  Request made of family/significant other to:  Remove weapons (e.g., guns, rifles, knives), all items previously/currently identified as safety concern.    Remove drugs/medications (over-the-counter, prescriptions, illicit drugs), all items previously/currently identified as a safety concern.  The family member/significant other verbalizes understanding of the suicide prevention education information provided.  The family member/significant other agrees to remove the items of safety concern listed above.  *He provides some history on Pt. And shares that it may not be best for her to come to his home. He recognizes that he may enable her some and that he also drinks.  He expresses concerns about where she will go and if she will be able/willing to go to treatment as he feels this is what she needs.    [name] Mihika Surrette, LCSW 01/03/2018, 4:20 PM

## 2018-01-03 NOTE — [facility] Suicide Risk Assessment (Signed)
[facility] Admission Suicide Risk Assessment   Nursing information obtained from:    Demographic factors:   32 yo female Current Mental Status:   labile, disorganized Loss Factors:    Historical Factors:   history of substance abuse Risk Reduction Factors:     Total Time spent with patient: 30 minutes Principal Problem: Psychosis (HCC) Diagnosis:   Patient Active Problem List   Diagnosis Date Noted  . Psychosis (HCC) [F29] 01/02/2018    Priority: High  . Bladder infection, chronic [N30.20] 01/24/2009  . Difficult or painful urination [R30.0] 10/11/2008  . 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  World Science writer [facility]). Score between 0-7:  no or low risk or alcohol related problems. Score between 8-15:  moderate risk of alcohol related problems. Score between 16-19:  high risk of alcohol related problems. Score 20 or above:  warrants further diagnostic evaluation for alcohol dependence and treatment.   CLINICAL FACTORS:   Alcohol/Substance Abuse/Dependencies   COGNITIVE FEATURES THAT CONTRIBUTE TO RISK:  None    SUICIDE RISK:   Minimal: No identifiable suicidal ideation.  Patients presenting with no risk factors but with morbid ruminations; may be classified as minimal risk based on the severity of the depressive symptoms  PLAN OF CARE: See H&P  I certify that inpatient services furnished can reasonably be expected to improve the patient's condition.   [name], MD 01/03/2018, 2:38 PM

## 2018-01-03 NOTE — [facility] Group Notes (Signed)
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
## 2018-01-03 NOTE — Plan of Care (Signed)
Patient is alert and oriented x 4, up ad lib with steady gait. Patient denies SI/HI/AVH  and pain at this time. Reports that she slept good last night without the use of sleep medication. States that her appetite is fair, her energy level is normal and concentration is good. Denies any depression, anxiety and hopelessness although patient became upset today after MD informed her that she would not be leaving. States, "My boss told me that if I don't get there today that I would be fired." This Clinical research associate provided encouragement to patient. Milieu remains safe with q 15 minute safety checks.

## 2018-01-04 MED ORDER — OLANZAPINE 5 MG PO TABS
5.0000 mg | ORAL_TABLET | Freq: Every day | ORAL | Status: DC
  Administered 2018-01-04 – 2018-01-05 (×2): 5 mg via ORAL
  Filled 2018-01-04 (×2): qty 1

## 2018-01-04 NOTE — Plan of Care (Signed)
Patient aware of information received  from [facility] . Voice no concerns around sleep. Working on Pharmacologist . Voice of no safety concerns .  Interacting  with peers and staff . Attending unit programs able to verbalize feelings. Voice no concerns around bowel motility   Problem: Elimination: Goal: Will not experience complications related to bowel motility Outcome: Progressing   Problem: Coping: Goal: Coping ability will improve Outcome: Progressing Goal: Will verbalize feelings Outcome: Progressing   Problem: Activity: Goal: Will verbalize the importance of balancing activity with adequate rest periods Outcome: Progressing   Problem: Safety: Goal: Periods of time without injury will increase Outcome: Progressing   Problem: Education: Goal: Knowledge of [facility] Education information/materials will improve Outcome: Progressing   Problem: Activity: Goal: Sleeping patterns will improve Outcome: Progressing

## 2018-01-04 NOTE — Progress Notes (Signed)
Recreation Therapy Notes  INPATIENT RECREATION THERAPY ASSESSMENT  Patient Details Name: Claudia Duke MRN: [id number] DOB: [birth year] Today's Date: 01/04/2018       Information Obtained From: Patient  Able to Participate in Assessment/Interview: Yes  Patient Presentation: Responsive  Reason for Admission (Per Patient): Other (Comments)(I was trying to catch a ride)  Patient Stressors:    Coping Skills:   Isolation, Avoidance  Leisure Interests (2+):  Individual - TV, Music - Listen(Working)  Frequency of Recreation/Participation: Weekly  Awareness of Community Resources:  No  Community Resources:     Current Use:    If no, Barriers?:    Expressed Interest in [employer] Information:    [place]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

## 2018-01-04 NOTE — BHH Group Notes (Signed)
LCSW Group Therapy Note  01/04/2018 1:00 pm  Type of Therapy/Topic:  Group Therapy:  Emotion Regulation  Participation Level:  Active   Description of Group:    The purpose of this group is to assist patients in learning to regulate negative emotions and experience positive emotions. Patients will be guided to discuss ways in which they have been vulnerable to their negative emotions. These vulnerabilities will be juxtaposed with experiences of positive emotions or situations, and patients will be challenged to use positive emotions to combat negative ones. Special emphasis will be placed on coping with negative emotions in conflict situations, and patients will process healthy conflict resolution skills.  Therapeutic Goals: 1. Patient will identify two positive emotions or experiences to reflect on in order to balance out negative emotions 2. Patient will label two or more emotions that they find the most difficult to experience 3. Patient will demonstrate positive conflict resolution skills through discussion and/or role plays  Summary of Patient Progress:  Claudia Duke actively participated in today's discussion on emotion regulation.  Claudia Duke shared that she often experiences loneliness and hopelessness which have been the two emotions that she has found most difficult.  Claudia Duke shared that she has often turned to using drugs to help her "cope" with her emotions.  Claudia Duke shared that she has not used positive conflict resolution skills in a long while, but now realizes that she must learn to stay away from peers that are going to encourage her to use drugs.  Claudia Duke was able to share that she often using working and playing basketball as positive and healthy coping strategies.       Therapeutic Modalities:   Cognitive Behavioral Therapy Feelings Identification Dialectical Behavioral Therapy

## 2018-01-04 NOTE — Progress Notes (Signed)
D: Patient aware of information received  from [facility] . Voice no concerns around sleep. Working on Pharmacologist . Voice of no safety concerns .  Interacting  with peers and staff . Attending unit programs able to verbalize feelings. Voice no concerns around bowel motility Patient stated slept good last night .Stated appetite is good and energy level  Is normal. Stated concentration is good . Stated on Depression scale 0, hopeless 0 and anxiety 0 .( low 0-10 high) Denies suicidal  homicidal ideations  .  No auditory hallucinations  No pain concerns . Appropriate ADL'S. Interacting with peers and staff.  Patient voice of needing to find a doctor . 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  A: Encourage patient participation with unit programming . Instruction  Given on  Medication , verbalize understanding.  R: Voice no other concerns. Staff continue to monitor

## 2018-01-04 NOTE — Plan of Care (Addendum)
Patient found in day room upon my arrival. Patient is visible and somewhat social this evening. Denies all complaints including SI/HI/AVH. Reports eating and voiding adequately. Appearance is improved. Speech remains minimal but affect is brighter, less irritable. Compliant with HS medications, without prompting. Compliant with unit routine and staff direction. Q 15 minute checks maintained. Will continue to monitor throughout the shift. Patient slept 6.75 hours. No apparent distress. Will endorse care to oncoming shift.  Problem: Education: Goal: Knowledge of [facility] Education information/materials will improve Outcome: Progressing   Problem: Activity: Goal: Sleeping patterns will improve Outcome: Progressing   Problem: Coping: Goal: Ability to demonstrate self-control will improve Outcome: Progressing   Problem: Coping: Goal: Coping ability will improve Outcome: Progressing

## 2018-01-04 NOTE — Tx Team (Addendum)
Interdisciplinary Treatment and Diagnostic Plan Update  01/04/2018 Time of Session: 11:00 AM Claudia Duke MRN: [id number]  Principal Diagnosis: Psychosis [facility])  Secondary Diagnoses: Principal Problem:   Psychosis (HCC)   Current Medications:  Current Facility-Administered Medications  Medication Dose Route Frequency Provider Last Rate Last Dose  . acetaminophen (TYLENOL) tablet 650 mg  650 mg Oral Q6H PRN Clapacs, [name], MD      . alum & mag hydroxide-simeth (MAALOX/MYLANTA) 200-200-20 MG/5ML suspension 30 mL  30 mL Oral Q4H PRN Clapacs, [name], MD      . buprenorphine-naloxone (SUBOXONE) 8-2 mg per SL tablet 1 tablet  1 tablet Sublingual TID [name], MD   1 tablet at 01/04/18 1208  . ciprofloxacin (CIPRO) tablet 500 mg  500 mg Oral BID McNew, [name], MD   500 mg at 01/04/18 0825  . hydrOXYzine (ATARAX/VISTARIL) tablet 50 mg  50 mg Oral TID PRN Clapacs, [name], MD   50 mg at 01/02/18 2143  . magnesium hydroxide (MILK OF MAGNESIA) suspension 30 mL  30 mL Oral Daily PRN Clapacs, [name], MD      . OLANZapine (ZYPREXA) injection 5 mg  5 mg Intramuscular Q6H PRN McNew, [name], MD      . OLANZapine (ZYPREXA) tablet 5 mg  5 mg Oral QHS McNew, [name], MD       PTA Medications: Medications Prior to Admission  Medication Sig Dispense Refill Last Dose  . amphetamine-dextroamphetamine (ADDERALL) 20 MG tablet Take 20 mg by mouth 2 (two) times daily.  0 Past Week at Unknown time  . buprenorphine (SUBUTEX) 8 MG SUBL SL tablet Place 8 mg under the tongue 3 (three) times daily.  0 Past Week at Unknown time  . ketorolac (TORADOL) 10 MG tablet Take 1 tablet (10 mg total) by mouth every 8 (eight) hours as needed. (Patient not taking: Reported on 01/02/2018) 20 tablet 0 Not Taking at Unknown time  . ondansetron (ZOFRAN-ODT) 4 MG disintegrating tablet Take 1 tablet (4 mg total) by mouth every 8 (eight) hours as needed for nausea or vomiting. (Patient not taking: Reported on 01/02/2018) 20  tablet 0 Not Taking at Unknown time    Patient Stressors: Medication change or noncompliance Substance abuse  Patient Strengths: Ability for insight Average or above average intelligence Capable of independent living Supportive family/friends  Treatment Modalities: Medication Management, Group therapy, Case management,  1 to 1 session with clinician, Psychoeducation, Recreational therapy.   Physician Treatment Plan for Primary Diagnosis: Psychosis (HCC) Long Term Goal(s): Improvement in symptoms so as ready for discharge   Short Term Goals: Ability to demonstrate self-control will improve  Medication Management: Evaluate patient's response, side effects, and tolerance of medication regimen.  Therapeutic Interventions: 1 to 1 sessions, Unit Group sessions and Medication administration.  Evaluation of Outcomes: Progressing  Physician Treatment Plan for Secondary Diagnosis: Principal Problem:   Psychosis (HCC)  Long Term Goal(s): Improvement in symptoms so as ready for discharge   Short Term Goals: Ability to demonstrate self-control will improve     Medication Management: Evaluate patient's response, side effects, and tolerance of medication regimen.  Therapeutic Interventions: 1 to 1 sessions, Unit Group sessions and Medication administration.  Evaluation of Outcomes: Progressing   RN Treatment Plan for Primary Diagnosis: Psychosis (HCC) Long Term Goal(s): Knowledge of disease and therapeutic regimen to maintain health will improve  Short Term Goals: Ability to participate in decision making will improve, Ability to identify and develop effective coping behaviors  will improve and Compliance with prescribed medications will improve  Medication Management: RN will administer medications as ordered by provider, will assess and evaluate patient's response and provide education to patient for prescribed medication. RN will report any adverse and/or side effects to prescribing  provider.  Therapeutic Interventions: 1 on 1 counseling sessions, Psychoeducation, Medication administration, Evaluate responses to treatment, Monitor vital signs and CBGs as ordered, Perform/monitor CIWA, COWS, AIMS and Fall Risk screenings as ordered, Perform wound care treatments as ordered.  Evaluation of Outcomes: Progressing   LCSW Treatment Plan for Primary Diagnosis: Psychosis (HCC) Long Term Goal(s): Safe transition to appropriate next level of care at discharge, Engage patient in therapeutic group addressing interpersonal concerns.  Short Term Goals: Engage patient in aftercare planning with referrals and resources and Increase skills for wellness and recovery  Therapeutic Interventions: Assess for all discharge needs, 1 to 1 time with Social worker, Explore available resources and support systems, Assess for adequacy in community support network, Educate family and significant other(s) on suicide prevention, Complete Psychosocial Assessment, Interpersonal group therapy.  Evaluation of Outcomes: Progressing   Progress in Treatment: Attending groups: Yes. Participating in groups: Yes. Taking medication as prescribed: Yes. Toleration medication: Yes. Family/Significant other contact made: Yes, individual(s) contacted:  Pt's father, Claudia Duke has been contacted. Patient understands diagnosis: Yes. Discussing patient identified problems/goals with staff: Yes. Medical problems stabilized or resolved: Yes. Denies suicidal/homicidal ideation: Yes. Issues/concerns per patient self-inventory: Yes. Other: n/a  New problem(s) identified: No, Describe:  No new problems identified.  New Short Term/Long Term Goal(s):  Patient Goals:  "to find a doctor to work with that I can afford now that I don't have insurance.  I also need to be discharged so I can go back to work and not lose my job."  Discharge Plan or Barriers: Tentative discharge plan is for pt to return to [place], [place] and  live in a friend's camper.  She will received outpatient follow-up with a provider that will be identified in [place], [place] or surrounding area in which she will be residing.  Reason for Continuation of Hospitalization: Anxiety Medication stabilization Other; describe mood lability, paranoia  Estimated Length of Stay: 1-2 days  Recreational Therapy: Patient Stressors: N/A Patient Goal: Patient will engage in groups without prompting or encouragement from LRT x3 group sessions within 5 recreation therapy group sessions  Attendees: Patient: Claudia Duke 01/04/2018 3:19 PM  Physician: Corinna Gab, MD 01/04/2018 3:19 PM  Nursing: Hulan Amato, RN 01/04/2018 3:19 PM  RN Care Manager: 01/04/2018 3:19 PM  Social Worker: Huey Romans, LCSW 01/04/2018 3:19 PM  Recreational Therapist: Garret Reddish, LRT 01/04/2018 3:19 PM  Other: Johny Shears, LCSWA 01/04/2018 3:19 PM  Other:  01/04/2018 3:19 PM  Other: 01/04/2018 3:19 PM    Scribe for Treatment Team: Alease Frame, LCSW 01/04/2018 3:19 PM

## 2018-01-04 NOTE — Progress Notes (Signed)
Recreation Therapy Notes  Date: 01/04/2018  Time: 9:30 am   Location: Craft Room   Behavioral response: N/A   Intervention Topic:  Problem Solving  Discussion/Intervention: Patient did not attend group.   Clinical Observations/Feedback:  Patient did not attend group.   Claudia Duke LRT/CTRS         Claudia Duke 01/04/2018 10:22 AM 

## 2018-01-04 NOTE — Progress Notes (Signed)
[facility] MD Progress Note  01/04/2018 2:07 PM Claudia Duke  MRN:  [id number] Subjective:  Pt is much calmer today. She states that she was just upset about how things escalated when she got here. She states, "I completely understand now." She admits that she used meth and this was the cause of what had happened. She stats taht she needs to "stay away from certain people." She is much more organized today and no longer labile. She is very calm. She states that she feels very groggy today due to Zyprexa but did sleep through the night. SEH denies SI, HI, AH< VH. She states taht she will either stay with her dad on discharge or stay in a friend's camper. She wants to get back to work soon.   Principal Problem: Psychosis (HCC) Diagnosis:   Patient Active Problem List   Diagnosis Date Noted  . Psychosis (HCC) [F29] 01/02/2018    Priority: High  . Bladder infection, chronic [N30.20] 01/24/2009  . Difficult or painful urination [R30.0] 10/11/2008  . 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 Gallstone   . Hepatitis    History reviewed. No pertinent surgical history. Family History: History reviewed. No pertinent family history. Family Psychiatric  History: See H&P Social History:  Social History   Substance and Sexual Activity  Alcohol Use Yes     Social History   Substance and Sexual Activity  Drug Use Yes  . Types: Methamphetamines, Amphetamines    Social History   Socioeconomic History  . Marital status: Married    Spouse name: Not on file  . Number of children: Not on file  . Years of education: Not on file  . Highest education level: Not on file  Occupational History  . Not on file  Social Needs  . Financial resource strain: Not on file  . Food insecurity:    Worry: Not on  file    Inability: Not on file  . Transportation needs:    Medical: Not on file    Non-medical: Not on file  Tobacco Use  . Smoking status: Current Every Day Smoker    Packs/day: 1.00    Types: Cigarettes  . Smokeless tobacco: Never Used  Substance and Sexual Activity  . Alcohol use: Yes  . Drug use: Yes    Types: Methamphetamines, Amphetamines  . Sexual activity: Not on file  Lifestyle  . Physical activity:    Days per week: Not on file    Minutes per session: Not on file  . Stress: Not on file  Relationships  . Social connections:    Talks on phone: Not on file    Gets together: Not on file    Attends religious service: Not on file    Active member of club or organization: Not on file    Attends meetings of clubs or organizations: Not on file    Relationship status: Not on file  Other Topics Concern  . Not on file  Social History Narrative  . Not on file   Additional Social History:                         Sleep: Good  Appetite:  Good  Current Medications: Current Facility-Administered Medications  Medication Dose Route Frequency Provider Last Rate Last  Dose  . acetaminophen (TYLENOL) tablet 650 mg  650 mg Oral Q6H PRN Clapacs, [name], MD      . alum & mag hydroxide-simeth (MAALOX/MYLANTA) 200-200-20 MG/5ML suspension 30 mL  30 mL Oral Q4H PRN Clapacs, [name], MD      . buprenorphine-naloxone (SUBOXONE) 8-2 mg per SL tablet 1 tablet  1 tablet Sublingual TID [name], MD   1 tablet at 01/04/18 1208  . ciprofloxacin (CIPRO) tablet 500 mg  500 mg Oral BID McNew, [name], MD   500 mg at 01/04/18 0825  . hydrOXYzine (ATARAX/VISTARIL) tablet 50 mg  50 mg Oral TID PRN Clapacs, [name], MD   50 mg at 01/02/18 2143  . magnesium hydroxide (MILK OF MAGNESIA) suspension 30 mL  30 mL Oral Daily PRN Clapacs, [name], MD      . OLANZapine (ZYPREXA) injection 5 mg  5 mg Intramuscular Q6H PRN McNew, [name], MD      . OLANZapine (ZYPREXA) tablet 5 mg  5 mg Oral QHS  McNew, [name], MD        Lab Results:  Results for orders placed or performed during the hospital encounter of 01/01/18 (from the past 48 hour(s))  Pregnancy, urine     Status: None   Collection Time: 01/02/18  8:21 PM  Result Value Ref Range   Preg Test, Ur NEGATIVE NEGATIVE    Comment: Performed at [facility], [address], [place], [place] [address]    Blood Alcohol level:  Lab Results  Component Value Date   [facility] <10 01/01/2018    Metabolic Disorder Labs: Lab Results  Component Value Date   HGBA1C 4.7 (L) 01/01/2018   MPG 88.19 01/01/2018   No results found for: PROLACTIN Lab Results  Component Value Date   CHOL 207 (H) 01/01/2018   TRIG 45 01/01/2018   HDL 107 01/01/2018   CHOLHDL 1.9 01/01/2018   VLDL 9 01/01/2018   LDLCALC 91 01/01/2018    Physical Findings: AIMS:  , ,  ,  ,    CIWA:    COWS:     Musculoskeletal: Strength & Muscle Tone: within normal limits Gait & Station: normal Patient leans: N/A  Psychiatric Specialty Exam: Physical Exam  Nursing note and vitals reviewed.   Review of Systems  All other systems reviewed and are negative.   Blood pressure 100/68, pulse 94, temperature 98.3 F (36.8 C), temperature source Oral, resp. rate 16, height  (1.626 m), weight 62.6 kg (138 lb), SpO2 100 %.Body mass index is 23.69 kg/m.  General Appearance: Casual, better hygiene  Eye Contact:  Minimal  Speech:  Clear and Coherent  Volume:  Normal  Mood:  Euthymic  Affect:  Appropriate  Thought Process:  Coherent and Goal Directed  Orientation:  Full (Time, Place, and Person)  Thought Content:  Logical  Suicidal Thoughts:  No  Homicidal Thoughts:  No  Memory:  NA  Judgement:  Impaired  Insight:  Lacking  Psychomotor Activity:  Normal  Concentration:  Concentration: Fair  Recall:  [employer] of Knowledge:  Fair  Language:  Fair  Akathisia:  No      Assets:  Resilience  ADL's:  Intact  Cognition:  WNL  Sleep:  Number of  Hours: 7.75     Treatment Plan Summary: 32 yo female admitted due to psychosis. She is much calmer and organized today. She did take Zyprexa and Cipor yesterday. She has better insight today. She  admitted to meth use which was likely the cause of psychosis. Pt was treated for lice on arrival and is being treated for UTI. Hygiene is better today.   Plan:  Psychosis-likely substance induced -Decrease Zyprexa to 5 mg qhs  History of opiate dependence -Continue Suboxone 8 mg TID. Dose confirmed  Dispo -Pt will discharge to self care.  [name], MD 01/04/2018, 2:07 PM

## 2018-01-05 MED ORDER — BUPRENORPHINE HCL-NALOXONE HCL 8-2 MG SL SUBL
1.0000 | SUBLINGUAL_TABLET | Freq: Once | SUBLINGUAL | Status: AC
  Administered 2018-01-05: 1 via SUBLINGUAL

## 2018-01-05 NOTE — Progress Notes (Signed)
Recreation Therapy Notes   Date: 01/05/2018  Time: 9:30 am   Location: Craft Room   Behavioral response: N/A   Intervention Topic:  Communication  Discussion/Intervention: Patient did not attend group.   Clinical Observations/Feedback:  Patient did not attend group.   Chadd Tollison LRT/CTRS        Shishir Krantz 01/05/2018 10:37 AM

## 2018-01-05 NOTE — BHH Group Notes (Signed)
LCSW Group Therapy Note  01/05/2018 1:00 pm  Type of Therapy/Topic:  Group Therapy:  Balance in Life  Participation Level:  Did Not Attend  Description of Group:    This group will address the concept of balance and how it feels and looks when one is unbalanced. Patients will be encouraged to process areas in their lives that are out of balance and identify reasons for remaining unbalanced. Facilitators will guide patients in utilizing problem-solving interventions to address and correct the stressor making their life unbalanced. Understanding and applying boundaries will be explored and addressed for obtaining and maintaining a balanced life. Patients will be encouraged to explore ways to assertively make their unbalanced needs known to significant others in their lives, using other group members and facilitator for support and feedback.  Therapeutic Goals: 1. Patient will identify two or more emotions or situations they have that consume much of in their lives. 2. Patient will identify signs/triggers that life has become out of balance:  3. Patient will identify two ways to set boundaries in order to achieve balance in their lives:  4. Patient will demonstrate ability to communicate their needs through discussion and/or role plays  Summary of Patient Progress: Claudia Duke was invited to today's group, but chose not to attend.     Therapeutic Modalities:   Cognitive Behavioral Therapy Solution-Focused Therapy Assertiveness Training  Alease Frame, [place] 01/05/2018 3:01 PM

## 2018-01-05 NOTE — Progress Notes (Signed)
[facility] MD Progress Note  01/05/2018 2:17 PM Claudia Duke  MRN:  [id number] Subjective:  Pt is calm today. She is much more organized in thoughts. She has not had any unsafe or bizzare behaviors on the unit. Hygiene is better. She denise SI or HI. Denies AH, VH. She is not interested in continuing Zyprexa as an outpatient. She only wants to be on Suboxone.   Principal Problem: Psychosis (HCC) Diagnosis:   Patient Active Problem List   Diagnosis Date Noted  . Psychosis (HCC) [F29] 01/02/2018    Priority: High  . Bladder infection, chronic [N30.20] 01/24/2009  . Difficult or painful urination [R30.0] 10/11/2008  . 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 Gallstone   . Hepatitis    History reviewed. No pertinent surgical history. Family History: History reviewed. No pertinent family history. Family Psychiatric  History: See H&P Social History:  Social History   Substance and Sexual Activity  Alcohol Use Yes     Social History   Substance and Sexual Activity  Drug Use Yes  . Types: Methamphetamines, Amphetamines    Social History   Socioeconomic History  . Marital status: Married    Spouse name: Not on file  . Number of children: Not on file  . Years of education: Not on file  . Highest education level: Not on file  Occupational History  . Not on file  Social Needs  . Financial resource strain: Not on file  . Food insecurity:    Worry: Not on file    Inability: Not on file  . Transportation needs:    Medical: Not on file    Non-medical: Not on file  Tobacco Use  . Smoking status: Current Every Day Smoker    Packs/day: 1.00    Types: Cigarettes  . Smokeless tobacco: Never Used  Substance and Sexual Activity  . Alcohol use: Yes  . Drug use: Yes    Types:  Methamphetamines, Amphetamines  . Sexual activity: Not on file  Lifestyle  . Physical activity:    Days per week: Not on file    Minutes per session: Not on file  . Stress: Not on file  Relationships  . Social connections:    Talks on phone: Not on file    Gets together: Not on file    Attends religious service: Not on file    Active member of club or organization: Not on file    Attends meetings of clubs or organizations: Not on file    Relationship status: Not on file  Other Topics Concern  . Not on file  Social History Narrative  . Not on file   Additional Social History:                         Sleep: Good  Appetite:  Good  Current Medications: Current Facility-Administered Medications  Medication Dose Route Frequency Provider Last Rate Last Dose  . acetaminophen (TYLENOL) tablet 650 mg  650 mg Oral Q6H PRN Clapacs, [name], MD      . alum & mag hydroxide-simeth (MAALOX/MYLANTA) 200-200-20 MG/5ML suspension 30 mL  30 mL Oral Q4H PRN Clapacs, [name], MD      . buprenorphine-naloxone (SUBOXONE) 8-2 mg per SL tablet 1 tablet  1 tablet Sublingual TID  [name], MD   1 tablet at 01/05/18 1610  . ciprofloxacin (CIPRO) tablet 500 mg  500 mg Oral BID McNew, [name], MD   500 mg at 01/05/18 0919  . hydrOXYzine (ATARAX/VISTARIL) tablet 50 mg  50 mg Oral TID PRN Clapacs, [name], MD   50 mg at 01/02/18 2143  . magnesium hydroxide (MILK OF MAGNESIA) suspension 30 mL  30 mL Oral Daily PRN Clapacs, [name], MD      . OLANZapine (ZYPREXA) injection 5 mg  5 mg Intramuscular Q6H PRN McNew, [name], MD      . OLANZapine (ZYPREXA) tablet 5 mg  5 mg Oral QHS McNew, [name], MD   5 mg at 01/04/18 2117    Lab Results: No results found for this or any previous visit (from the past 48 hour(s)).  Blood Alcohol level:  Lab Results  Component Value Date   ETH <10 01/01/2018    Metabolic Disorder Labs: Lab Results  Component Value Date   HGBA1C 4.7 (L) 01/01/2018   MPG 88.19  01/01/2018   No results found for: PROLACTIN Lab Results  Component Value Date   CHOL 207 (H) 01/01/2018   TRIG 45 01/01/2018   HDL 107 01/01/2018   CHOLHDL 1.9 01/01/2018   VLDL 9 01/01/2018   LDLCALC 91 01/01/2018    Physical Findings: AIMS:  , ,  ,  ,    CIWA:    COWS:     Musculoskeletal: Strength & Muscle Tone: within normal limits Gait & Station: normal Patient leans: N/A  Psychiatric Specialty Exam: Physical Exam  Nursing note and vitals reviewed.   Review of Systems  All other systems reviewed and are negative.   Blood pressure (!) 91/55, pulse (!) 55, temperature 98.1 F (36.7 C), temperature source Oral, resp. rate 18, height  (1.626 m), weight 62.6 kg (138 lb), SpO2 99 %.Body mass index is 23.69 kg/m.  General Appearance: Casual  Eye Contact:  Fair  Speech:  Clear and Coherent  Volume:  Normal  Mood:  Irritable  Affect:  Appropriate  Thought Process:  Coherent and Goal Directed  Orientation:  Full (Time, Place, and Person)  Thought Content:  Logical  Suicidal Thoughts:  No  Homicidal Thoughts:  No  Memory:  Immediate;   Fair  Judgement:  Fair  Insight:  Lacking  Psychomotor Activity:  Normal  Concentration:  Concentration: Fair  Recall:  [employer] of Knowledge:  Fair  Language:  Fair  Akathisia:  No      Assets:  Resilience  ADL's:  Intact  Cognition:  WNL  Sleep:  Number of Hours: 6.75     Treatment Plan Summary: 32 yo female admitted due to bizzare behaviors and psychosis. She is much more coherent and organized. These behaviors were likely due to methamphetamine use. She is taking better care of herself and hygiene is improved.  Plan:  Psychosis -Continue Zyprexa 5 mg tonight. She does not want to continue this outpatient.   Opiate use disorder -Suboxone 8 mg TID  Dispo -She will discharge tomorrow  [name], MD 01/05/2018, 2:17 PM

## 2018-01-05 NOTE — Progress Notes (Signed)
Offered patient scheduled medication for 1200. Patient requested to have this medication at 1500. Patient will be offered medication again at requested time.

## 2018-01-05 NOTE — Plan of Care (Signed)
Data: Patient is appropriate and cooperative to assessment. Patient denies SI/HI/AVH. Patient has completed daily self inventory worksheet. Patient reports good mood. Patient has no complaints and a pain rating of 0/10. Patient reports good sleep quality, appetite is good. Patient rates depression "0/10" , feelings of hopelessness "0/10" and anxiety "0/10" Patients goal for today is "N/A." Patient is in room sleeping most of the day, patient is out for meals.    Action:  Q x 15 minute observation checks were completed for safety. Patient was provided with education on medications. Patient was offered support and encouragement. Patient was given scheduled medications. Patient  was encourage to attend groups, participate in unit activities and continue with plan of care.     Response: Patient is compliant with medication. Patient has no complaints at this time. Patient is receptive to treatment and safety maintained on unit.    Problem: Education: Goal: Knowledge of [place] [facility] Education information/materials will improve Outcome: Progressing   Problem: Coping: Goal: Ability to demonstrate self-control will improve Outcome: Progressing   Problem: Safety: Goal: Periods of time without injury will increase Outcome: Progressing   Problem: Safety: Goal: Ability to disclose and discuss suicidal ideas will improve Outcome: Progressing

## 2018-01-05 NOTE — Progress Notes (Signed)
Offered scheduled 1200 medication. Patient refused at this time.

## 2018-01-05 NOTE — Plan of Care (Addendum)
Patient found in day room upon my arrival. Patient is visible and minimally social this evening. Patient smiles and reacts appropriately upon our initial encounter. Denies SI/HI/AVH. Denies depression and anxiety. Reports she will be discharged tomorrow and feels she is ready for discharge. While receiving HS medications, patient states, "I didn't take my Suboxone 3 times today, I want my 3rd dose." Educated patient regarding the fact that she did not take her 1200 dose and I am unable to dispense that medication 9 hours later. MAR record indicates patient had refused 1200 dose. Patient was compliant with HS medications but stormed out of medroom yelling, "You guys get off on this! Can't ask nobody for nuthin!" Patient then made loud, angry scream in hallway and went into her room. Dr. Toni Amend contacted. Suboxone  x 1 now ordered and administered SL. Educated patient regarding the scope of practice and that she left the medroom before I could offer to call the doctor. Patient was displaying the following opiate-related WD S/Sx: itching, anxiety, irritabily, agitation, and restlessness. Q 15 minute checks maintained. Will continue to monitor throughout the shift.  Patient slept 7 hours. No apparent distress. Will endorse care to oncoming shift.  Problem: Education: Goal: Knowledge of  General Education information/materials will improve Outcome: Progressing   Problem: Activity: Goal: Sleeping patterns will improve Outcome: Progressing   Problem: Coping: Goal: Will verbalize feelings Outcome: Progressing   Problem: Coping: Goal: Ability to demonstrate self-control will improve Outcome: Not Progressing   Problem: Coping: Goal: Coping ability will improve Outcome: Not Progressing

## 2018-01-05 NOTE — [facility] Group Notes (Signed)
LCSW Group Therapy Note 01/05/2018 9:00 AM  Type of Therapy and Topic:  Group Therapy:  Setting Goals  Participation Level:  Did Not Attend  Description of Group: In this process group, patients discussed using strengths to work toward goals and address challenges.  Patients identified two positive things about themselves and one goal they were working on.  Patients were given the opportunity to share openly and support each other's plan for self-empowerment.  The group discussed the value of gratitude and were encouraged to have a daily reflection of positive characteristics or circumstances.  Patients were encouraged to identify a plan to utilize their strengths to work on current challenges and goals.  Therapeutic Goals 1. Patient will verbalize personal strengths/positive qualities and relate how these can assist with achieving desired personal goals 2. Patients will verbalize affirmation of peers plans for personal change and goal setting 3. Patients will explore the value of gratitude and positive focus as related to successful achievement of goals 4. Patients will verbalize a plan for regular reinforcement of personal positive qualities and circumstances.  Summary of Patient Progress:  Taryne was invited to today's group, but chose not to attend.     Therapeutic Modalities Cognitive Behavioral Therapy Motivational Interviewing    Alease Frame, [place] 01/05/2018 3:00 PM

## 2018-01-06 NOTE — Discharge Summary (Signed)
Physician Discharge Summary Note  Patient:  Claudia Duke is an 32 y.o., female MRN:  [id number] DOB:  08/25/1985 Patient phone:  [phone] (home)  Patient address:   [address] [place] [address],  Total Time spent with patient: 20 minutes  Plus 20 minutes of medication reconciliation, discharge planning, and discharge documentation   Date of Admission:  01/02/2018 Date of Discharge: 01/06/18  Reason for Admission:  Paranoia  Principal Problem: Psychosis [facility](HCC) Discharge Diagnoses: Patient Active Problem List   Diagnosis Date Noted  . Psychosis (HCC) [F29] 01/02/2018    Priority: High  . Bladder infection, chronic [N30.20] 01/24/2009  . Difficult or painful urination [R30.0] 10/11/2008  . 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 Gallstone   . Hepatitis    History reviewed. No pertinent surgical history. Family History: History reviewed. No pertinent family history. Family Psychiatric  History: See H&P Social History:  Social History   Substance and Sexual Activity  Alcohol Use Yes     Social History   Substance and Sexual Activity  Drug Use Yes  . Types: Methamphetamines, Amphetamines    Social History   Socioeconomic History  . Marital status: Married    Spouse name: Not on file  . Number of children: Not on file  . Years of education: Not on file  . Highest education level: Not on file  Occupational History  . Not on file  Social Needs  . Financial resource strain: Not on file  . Food insecurity:    Worry: Not on file    Inability: Not on file  . Transportation needs:    Medical: Not on file    Non-medical: Not on file  Tobacco Use  . Smoking status: Current Every Day Smoker    Packs/day: 1.00    Types: Cigarettes  . Smokeless tobacco: Never Used  Substance and Sexual Activity  .  Alcohol use: Yes  . Drug use: Yes    Types: Methamphetamines, Amphetamines  . Sexual activity: Not on file  Lifestyle  . Physical activity:    Days per week: Not on file    Minutes per session: Not on file  . Stress: Not on file  Relationships  . Social connections:    Talks on phone: Not on file    Gets together: Not on file    Attends religious service: Not on file    Active member of club or organization: Not on file    Attends meetings of clubs or organizations: Not on file    Relationship status: Not on file  Other Topics Concern  . Not on file  Social History Narrative  . Not on file    Hospital Course:  Pt was initially started on Zyprexa for paranoia and lability of mood. She was found to have lice and was treated for this. Also found to have UTI and treated with 3 day course of Cipro.  Once she sobered up, she no longer displayed any psychotic symptoms. She was calm, organized and goal directed. She did not want to continue Zyprexa which is not likely indicated due to psychosis likely related to meth use. On day of discharge, she was organized and goal directed. She had much better hygiene. She was adamant taht she wanted to discharge. She was not sure if she wanted to stay in [place] or  go back to [place]. She eventually decided on [place] since that is where he job it. She will stay in a shelter. She is very capable of taking care of herself.  She is on Suboxone and Adderall. Adderall was not restarted and do not recommend her to continue getting prescriptions for this due to substance abuse. Pt consistently denied SI or any thoughts of self harm. Denied HI, AH, VH. She no longer meets IVC criteria.   The patient is at low risk of imminent suicide. Patient denied thoughts, intent, or plan for harm to self or others, expressed significant future orientation, and expressed an ability to mobilize assistance for her needs. She is presently void of any contributing psychiatric symptoms,  cognitive difficulties, or substance use which would elevate his risk for lethality. Chronic risk for lethality is elevated in light of homelessness, substance abuse. The chronic risk is presently mitigated by her ongoing desire and engagement in [facility] treatment and mobilization of support from family and friends. Chronic risk may elevate if she experiences any significant loss or worsening of symptoms, which can be managed and monitored through outpatient providers. At this time,a cute risk for lethality is low and she is stable for ongoing outpatient management.   Modifiable risk factors were addressed during this hospitalization through appropriate pharmacotherapy and establishment of outpatient follow-up treatment. Some risk factors for suicide are situational (i.e. Unstable housing) or related personality pathology (i.e. Poor coping mechanisms) and thus cannot be further mitigated by continued hospitalization in this setting.    Physical Findings: AIMS:  , ,  ,  ,    CIWA:    COWS:     Musculoskeletal: Strength & Muscle Tone: within normal limits Gait & Station: normal Patient leans: N/A  Psychiatric Specialty Exam: Physical Exam  Nursing note and vitals reviewed.   Review of Systems  All other systems reviewed and are negative.   Blood pressure 103/67, pulse (!) 58, temperature 98 F (36.7 C), temperature source Oral, resp. rate 16, height 5\' 4"  (1.626 m), weight 62.6 kg (138 lb), SpO2 100 %.Body mass index is 23.69 kg/m.  General Appearance: Casual  Eye Contact:  Good  Speech:  Clear and Coherent  Volume:  Normal  Mood:  Euthymic  Affect:  Appropriate  Thought Process:  Coherent and Goal Directed  Orientation:  Full (Time, Place, and Person)  Thought Content:  Logical  Suicidal Thoughts:  No  Homicidal Thoughts:  No  Memory:  Recent;   Fair  Judgement:  Fair  Insight:  Fair  Psychomotor Activity:  Normal  Concentration:  Concentration: Fair  Recall:  Fair  Fund of  Knowledge:  Fair  Language:  Fair  Akathisia:  No      Assets:  Resilience  ADL's:  Intact  Cognition:  WNL  Sleep:  Number of Hours: 7        Has this patient used any form of tobacco in the last 30 days? (Cigarettes, Smokeless Tobacco, Cigars, and/or Pipes) Yes, Yes, A prescription for an FDA-approved tobacco cessation medication was offered at discharge and the patient refused  Blood Alcohol level:  Lab Results  Component Value Date   ETH <10 01/01/2018    Metabolic Disorder Labs:  Lab Results  Component Value Date   HGBA1C 4.7 (L) 01/01/2018   MPG 88.19 01/01/2018   No results found for: PROLACTIN Lab Results  Component Value Date   CHOL 207 (H) 01/01/2018   TRIG 45 01/01/2018   HDL 107 01/01/2018  CHOLHDL 1.9 01/01/2018   VLDL 9 01/01/2018   LDLCALC 91 01/01/2018    See Psychiatric Specialty Exam and Suicide Risk Assessment completed by Attending Physician prior to discharge.  Discharge destination:  Other:  Self care  Is patient on multiple antipsychotic therapies at discharge:  No   Has Patient had three or more failed trials of antipsychotic monotherapy by history:  No  Recommended Plan for Multiple Antipsychotic Therapies: NA  Discharge Instructions    Increase activity slowly   Complete by:  As directed      Allergies as of 01/06/2018      Reactions   Sulfamethoxazole-trimethoprim Hives      Medication List    STOP taking these medications   amphetamine-dextroamphetamine 20 MG tablet Commonly known as:  ADDERALL   ketorolac 10 MG tablet Commonly known as:  TORADOL   ondansetron 4 MG disintegrating tablet Commonly known as:  ZOFRAN-ODT     TAKE these medications     Indication  buprenorphine 8 MG Subl SL tablet Commonly known as:  SUBUTEX Place 8 mg under the tongue 3 (three) times daily.  Indication:  Opioid Dependence        Follow-up recommendations: Follow up with [facility] in [place] Signed: [name], MD 01/06/2018,  9:35 AM

## 2018-01-06 NOTE — Progress Notes (Signed)
Recreation Therapy Notes  Date: 01/06/2018  Time: 9:30 am   Location: Craft Room   Behavioral response: N/A   Intervention Topic:  Leisure  Discussion/Intervention: Patient did not attend group.   Clinical Observations/Feedback:  Patient did not attend group.   Chauna Osoria LRT/CTRS        Keng Jewel 01/06/2018 12:15 PM

## 2018-01-06 NOTE — Progress Notes (Signed)
  [facility] Adult [facility] Discharge Plan :  Will you be returning to the same living situation after discharge:  No. At discharge, do you have transportation home?: No. Do you have the ability to pay for your medications: Yes,  Pt has a job and can pay for medications.  Release of information consent forms completed and in the chart;  Patient's signature needed at discharge.  Patient to Follow up at: Follow-up Information    [facility] Follow up on 01/11/2018.   Why:  Your follow-up appointment is scheduled for 01/11/18 at 8:00 AM. Contact information: [address] [place], [place]  [id number] [phone] phone [phone] fax          Next level of care provider [facility] Link:no  Safety Planning and Suicide Prevention discussed: Yes,  No safety issues identified.     Has patient been referred to the Quitline?: Patient refused referral  Patient has been referred for addiction treatment: Pt. refused referral  Alease FrameSonya S Aurie Harroun, LCSW 01/06/2018, 12:31 PM

## 2018-01-06 NOTE — Progress Notes (Signed)
Recreation Therapy Notes  INPATIENT RECREATION TR PLAN  Patient Details Name: Claudia Duke MRN: [id number] DOB: 10/13/1985 Today's Date: 01/06/2018  Rec Therapy Plan Is patient appropriate for Therapeutic Recreation?: Yes Treatment times per week: at least 3 Estimated Length of Stay: 5-7 days TR Treatment/Interventions: Group participation (Comment)  Discharge Criteria Pt will be discharged from therapy if:: Discharged Treatment plan/goals/alternatives discussed and agreed upon by:: Patient/family  Discharge Summary Short term goals set: Patient will engage in groups without prompting or encouragement from LRT x3 group sessions within 5 recreation therapy group sessions Short term goals met: Not met Reason goals not met: Patient did not attend any groups. Therapeutic equipment acquired: N/A Reason patient discharged from therapy: Discharge from hospital Pt/family agrees with progress & goals achieved: Yes Date patient discharged from therapy: 01/06/18      01/06/2018, 3:22 PM  

## 2022-06-09 DEATH — deceased
